# Patient Record
Sex: Female | Born: 1980 | Race: White | Hispanic: No | Marital: Single | Smoking: Never smoker
Health system: Southern US, Community
[De-identification: ages and names within clinical notes are randomized; demographics above are authoritative.]

## PROBLEM LIST (undated history)

## (undated) HISTORY — PX: MYRINGOTOMY WITH TUBE PLACEMENT: SHX5663

---
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
## 2007-08-16 LAB — TSH: TSH: 2.35 (ref <=5.90)

## 2007-08-16 LAB — CBC AND DIFFERENTIAL
HCT: 35 — AB (ref 36–46)
Hemoglobin: 11.9 — AB (ref 12.0–16.0)
Neutrophils Absolute: 4
Platelets: 278 (ref 150–399)
WBC: 6.8

## 2007-08-16 LAB — HEPATIC FUNCTION PANEL
ALT: 23 (ref 7–35)
AST: 18 (ref 13–35)
Alkaline Phosphatase: 43 (ref 25–125)
Bilirubin, Total: 0.4

## 2007-08-16 LAB — LIPID PANEL
Cholesterol: 183 (ref 0–200)
HDL: 56 (ref 35–70)
LDL Cholesterol: 116
LDl/HDL Ratio: 3.3
Triglycerides: 55 (ref 40–160)

## 2007-08-16 LAB — BASIC METABOLIC PANEL
BUN: 10 (ref 4–21)
Creatinine: 0.6 (ref <=1.1)
Glucose: 96
Potassium: 4.3 (ref 3.4–5.3)
Sodium: 140 (ref 137–147)

## 2007-08-16 LAB — VITAMIN D 25 HYDROXY (VIT D DEFICIENCY, FRACTURES): Vit D, 25-Hydroxy: 19.8

## 2017-01-29 ENCOUNTER — Encounter: Payer: Self-pay | Admitting: Physician Assistant

## 2017-01-29 ENCOUNTER — Telehealth: Payer: Self-pay | Admitting: Physician Assistant

## 2017-01-29 ENCOUNTER — Ambulatory Visit (INDEPENDENT_AMBULATORY_CARE_PROVIDER_SITE_OTHER): Payer: BLUE CROSS/BLUE SHIELD | Admitting: Physician Assistant

## 2017-01-29 VITALS — BP 132/80 | HR 88 | Resp 16 | Ht 67.0 in | Wt 216.0 lb

## 2017-01-29 DIAGNOSIS — J45909 Unspecified asthma, uncomplicated: Secondary | ICD-10-CM | POA: Diagnosis not present

## 2017-01-29 DIAGNOSIS — R12 Heartburn: Secondary | ICD-10-CM

## 2017-01-29 DIAGNOSIS — G43109 Migraine with aura, not intractable, without status migrainosus: Secondary | ICD-10-CM

## 2017-01-29 DIAGNOSIS — Z800 Family history of malignant neoplasm of digestive organs: Secondary | ICD-10-CM | POA: Diagnosis not present

## 2017-01-29 DIAGNOSIS — Z7689 Persons encountering health services in other specified circumstances: Secondary | ICD-10-CM

## 2017-01-29 DIAGNOSIS — J3089 Other allergic rhinitis: Secondary | ICD-10-CM

## 2017-01-29 NOTE — Telephone Encounter (Signed)
Pt called to advise she takes Imitrex 100 MG.  NF#[phone]/IOCB#[phone]/MW

## 2017-01-29 NOTE — Telephone Encounter (Signed)
Updated in chart

## 2017-01-29 NOTE — Patient Instructions (Signed)
Please get previous lab records from work.   Please confirm dose of Imitrex.  Health Maintenance, Female Adopting a healthy lifestyle and getting preventive care can go a long way to promote health and wellness. Talk with your health care provider about what schedule of regular examinations is right for you. This is a good chance for you to check in with your provider about disease prevention and staying healthy. In between checkups, there are plenty of things you can do on your own. Experts have done a lot of research about which lifestyle changes and preventive measures are most likely to keep you healthy. Ask your health care provider for more information. Weight and diet Eat a healthy diet  Be sure to include plenty of vegetables, fruits, low-fat dairy products, and lean protein.  Do not eat a lot of foods high in solid fats, added sugars, or salt.  Get regular exercise. This is one of the most important things you can do for your health. ? Most adults should exercise for at least 150 minutes each week. The exercise should increase your heart rate and make you sweat (moderate-intensity exercise). ? Most adults should also do strengthening exercises at least twice a week. This is in addition to the moderate-intensity exercise.  Maintain a healthy weight  Body mass index (BMI) is a measurement that can be used to identify possible weight problems. It estimates body fat based on height and weight. Your health care provider can help determine your BMI and help you achieve or maintain a healthy weight.  For females 78 years of age and older: ? A BMI below 18.5 is considered underweight. ? A BMI of 18.5 to 24.9 is normal. ? A BMI of 25 to 29.9 is considered overweight. ? A BMI of 30 and above is considered obese.  Watch levels of cholesterol and blood lipids  You should start having your blood tested for lipids and cholesterol at 34 years of age, then have this test every 5 years.  You  may need to have your cholesterol levels checked more often if: ? Your lipid or cholesterol levels are high. ? You are older than 34 years of age. ? You are at high risk for heart disease.  Cancer screening Lung Cancer  Lung cancer screening is recommended for adults 28-27 years old who are at high risk for lung cancer because of a history of smoking.  A yearly low-dose CT scan of the lungs is recommended for people who: ? Currently smoke. ? Have quit within the past 15 years. ? Have at least a 30-pack-year history of smoking. A pack year is smoking an average of one pack of cigarettes a day for 1 year.  Yearly screening should continue until it has been 15 years since you quit.  Yearly screening should stop if you develop a health problem that would prevent you from having lung cancer treatment.  Breast Cancer  Practice breast self-awareness. This means understanding how your breasts normally appear and feel.  It also means doing regular breast self-exams. Let your health care provider know about any changes, no matter how small.  If you are in your 20s or 30s, you should have a clinical breast exam (CBE) by a health care provider every 1-3 years as part of a regular health exam.  If you are 29 or older, have a CBE every year. Also consider having a breast X-ray (mammogram) every year.  If you have a family history of breast cancer, talk  to your health care provider about genetic screening.  If you are at high risk for breast cancer, talk to your health care provider about having an MRI and a mammogram every year.  Breast cancer gene (BRCA) assessment is recommended for women who have family members with BRCA-related cancers. BRCA-related cancers include: ? Breast. ? Ovarian. ? Tubal. ? Peritoneal cancers.  Results of the assessment will determine the need for genetic counseling and BRCA1 and BRCA2 testing.  Cervical Cancer Your health care provider may recommend that you  be screened regularly for cancer of the pelvic organs (ovaries, uterus, and vagina). This screening involves a pelvic examination, including checking for microscopic changes to the surface of your cervix (Pap test). You may be encouraged to have this screening done every 3 years, beginning at age 50.  For women ages 57-65, health care providers may recommend pelvic exams and Pap testing every 3 years, or they may recommend the Pap and pelvic exam, combined with testing for human papilloma virus (HPV), every 5 years. Some types of HPV increase your risk of cervical cancer. Testing for HPV may also be done on women of any age with unclear Pap test results.  Other health care providers may not recommend any screening for nonpregnant women who are considered low risk for pelvic cancer and who do not have symptoms. Ask your health care provider if a screening pelvic exam is right for you.  If you have had past treatment for cervical cancer or a condition that could lead to cancer, you need Pap tests and screening for cancer for at least 20 years after your treatment. If Pap tests have been discontinued, your risk factors (such as having a new sexual partner) need to be reassessed to determine if screening should resume. Some women have medical problems that increase the chance of getting cervical cancer. In these cases, your health care provider may recommend more frequent screening and Pap tests.  Colorectal Cancer  This type of cancer can be detected and often prevented.  Routine colorectal cancer screening usually begins at 34 years of age and continues through 34 years of age.  Your health care provider may recommend screening at an earlier age if you have risk factors for colon cancer.  Your health care provider may also recommend using home test kits to check for hidden blood in the stool.  A small camera at the end of a tube can be used to examine your colon directly (sigmoidoscopy or  colonoscopy). This is done to check for the earliest forms of colorectal cancer.  Routine screening usually begins at age 33.  Direct examination of the colon should be repeated every 5-10 years through 34 years of age. However, you may need to be screened more often if early forms of precancerous polyps or small growths are found.  Skin Cancer  Check your skin from head to toe regularly.  Tell your health care provider about any new moles or changes in moles, especially if there is a change in a mole's shape or color.  Also tell your health care provider if you have a mole that is larger than the size of a pencil eraser.  Always use sunscreen. Apply sunscreen liberally and repeatedly throughout the day.  Protect yourself by wearing long sleeves, pants, a wide-brimmed hat, and sunglasses whenever you are outside.  Heart disease, diabetes, and high blood pressure  High blood pressure causes heart disease and increases the risk of stroke. High blood pressure is more  likely to develop in: ? People who have blood pressure in the high end of the normal range (130-139/85-89 mm Hg). ? People who are overweight or obese. ? People who are African American.  If you are 28-80 years of age, have your blood pressure checked every 3-5 years. If you are 21 years of age or older, have your blood pressure checked every year. You should have your blood pressure measured twice--once when you are at a hospital or clinic, and once when you are not at a hospital or clinic. Record the average of the two measurements. To check your blood pressure when you are not at a hospital or clinic, you can use: ? An automated blood pressure machine at a pharmacy. ? A home blood pressure monitor.  If you are between 4 years and 34 years old, ask your health care provider if you should take aspirin to prevent strokes.  Have regular diabetes screenings. This involves taking a blood sample to check your fasting blood sugar  level. ? If you are at a normal weight and have a low risk for diabetes, have this test once every three years after 34 years of age. ? If you are overweight and have a high risk for diabetes, consider being tested at a younger age or more often. Preventing infection Hepatitis B  If you have a higher risk for hepatitis B, you should be screened for this virus. You are considered at high risk for hepatitis B if: ? You were born in a country where hepatitis B is common. Ask your health care provider which countries are considered high risk. ? Your parents were born in a high-risk country, and you have not been immunized against hepatitis B (hepatitis B vaccine). ? You have HIV or AIDS. ? You use needles to inject street drugs. ? You live with someone who has hepatitis B. ? You have had sex with someone who has hepatitis B. ? You get hemodialysis treatment. ? You take certain medicines for conditions, including cancer, organ transplantation, and autoimmune conditions.  Hepatitis C  Blood testing is recommended for: ? Everyone born from [birth year] through 1963. ? Anyone with known risk factors for hepatitis C.  Sexually transmitted infections (STIs)  You should be screened for sexually transmitted infections (STIs) including gonorrhea and chlamydia if: ? You are sexually active and are younger than 34 years of age. ? You are older than 34 years of age and your health care provider tells you that you are at risk for this type of infection. ? Your sexual activity has changed since you were last screened and you are at an increased risk for chlamydia or gonorrhea. Ask your health care provider if you are at risk.  If you do not have HIV, but are at risk, it may be recommended that you take a prescription medicine daily to prevent HIV infection. This is called pre-exposure prophylaxis (PrEP). You are considered at risk if: ? You are sexually active and do not regularly use condoms or know the HIV  status of your partner(s). ? You take drugs by injection. ? You are sexually active with a partner who has HIV.  Talk with your health care provider about whether you are at high risk of being infected with HIV. If you choose to begin PrEP, you should first be tested for HIV. You should then be tested every 3 months for as long as you are taking PrEP. Pregnancy  If you are premenopausal and you may  become pregnant, ask your health care provider about preconception counseling.  If you may become pregnant, take 400 to 800 micrograms (mcg) of folic acid every day.  If you want to prevent pregnancy, talk to your health care provider about birth control (contraception). Osteoporosis and menopause  Osteoporosis is a disease in which the bones lose minerals and strength with aging. This can result in serious bone fractures. Your risk for osteoporosis can be identified using a bone density scan.  If you are 49 years of age or older, or if you are at risk for osteoporosis and fractures, ask your health care provider if you should be screened.  Ask your health care provider whether you should take a calcium or vitamin D supplement to lower your risk for osteoporosis.  Menopause may have certain physical symptoms and risks.  Hormone replacement therapy may reduce some of these symptoms and risks. Talk to your health care provider about whether hormone replacement therapy is right for you. Follow these instructions at home:  Schedule regular health, dental, and eye exams.  Stay current with your immunizations.  Do not use any tobacco products including cigarettes, chewing tobacco, or electronic cigarettes.  If you are pregnant, do not drink alcohol.  If you are breastfeeding, limit how much and how often you drink alcohol.  Limit alcohol intake to no more than 1 drink per day for nonpregnant women. One drink equals 12 ounces of beer, 5 ounces of wine, or 1 ounces of hard liquor.  Do not  use street drugs.  Do not share needles.  Ask your health care provider for help if you need support or information about quitting drugs.  Tell your health care provider if you often feel depressed.  Tell your health care provider if you have ever been abused or do not feel safe at home. This information is not intended to replace advice given to you by your health care provider. Make sure you discuss any questions you have with your health care provider. Document Released: 01/08/2011 Document Revised: 12/01/2015 Document Reviewed: 03/29/2015 Elsevier Interactive Patient Education  [employer].

## 2017-01-29 NOTE — Progress Notes (Signed)
Patient: Kim Thompson Female    DOB: 06-Jul-1981   36 y.o.   MRN: [id number] Visit Date: 01/29/2017  Today's Provider: Trey Sailors, PA-C   Chief Complaint  Patient presents with  . Establish Care  . Asthma  . Migraine   Subjective:      Kim Thompson is a 36 y/o woman who is presenting today to establish care. She moved down here from the [place] area to work at [employer]. She has been here since February, living in [place] since February. Previously worked in Landscape architect for [employer]. Has four rabbits.   Not sexually active, may be entering relationship. Cannot remember last PAP smear.  She has paternal grandmother with history of breast cancer. She has maternal uncle with history of colon cancer in 42's or 34's, mother was screened and was normal.   She has a history of migraines with aura for which she takes Imitrex. She has 1-3 migraines per month, thinks they may be associated with menstrual cycle. Has visual floaters as aura.   Had labs done with employment, would like this.  Has history of asthma, well controlled on Advair 100-50 2 puffs daily and rescue inhaler. Does also have nebulizer treatment. Asthma worse with humid air in [place]. Is also on singulair.   Would like to try coming off omeprazole that she uses for heartburn.   Asthma  This is a chronic problem. The problem has been unchanged (Pt reports it is in decent control. ). Pertinent negatives include no appetite change, chest pain, dyspnea on exertion, ear congestion, ear pain, fever, headaches, heartburn, malaise/fatigue, myalgias, nasal congestion, orthopnea, PND, postnasal drip, rhinorrhea, sneezing, sore throat, sweats, trouble swallowing or weight loss. Her past medical history is significant for asthma.  Migraine   This is a chronic problem. The problem has been unchanged. The pain is located in the right unilateral region. The pain does not radiate. The pain quality is  similar to prior headaches. The quality of the pain is described as throbbing. Pertinent negatives include no back pain, dizziness, ear pain, fever, neck pain, rhinorrhea, sore throat or weight loss. She has tried triptans for the symptoms. The treatment provided significant relief.       Allergies  Allergen Reactions  . Augmentin [Amoxicillin-Pot Clavulanate]     Vomiting  . Sulfa Antibiotics     Rash     Current Outpatient Prescriptions:  .  albuterol (ACCUNEB) 1.25 MG/3ML nebulizer solution, Take 1 ampule by nebulization every 6 (six) hours as needed for wheezing., Disp: , Rfl:  .  albuterol (VENTOLIN HFA) 108 (90 Base) MCG/ACT inhaler, Inhale 2 puffs into the lungs every 4 (four) hours as needed for wheezing or shortness of breath., Disp: , Rfl:  .  fexofenadine (ALLEGRA) 180 MG tablet, Take 180 mg by mouth daily., Disp: , Rfl:  .  Fluticasone-Salmeterol (ADVAIR DISKUS) 100-50 MCG/DOSE AEPB, Inhale 1 puff into the lungs 2 (two) times daily., Disp: , Rfl:  .  montelukast (SINGULAIR) 10 MG tablet, Take 10 mg by mouth at bedtime., Disp: , Rfl:  .  omeprazole (PRILOSEC) 20 MG capsule, Take 20 mg by mouth daily., Disp: , Rfl:   Review of Systems  Constitutional: Negative.  Negative for appetite change, fever, malaise/fatigue and weight loss.  HENT: Negative.  Negative for ear pain, postnasal drip, rhinorrhea, sneezing, sore throat and trouble swallowing.   Eyes: Negative.   Respiratory: Negative.   Cardiovascular: Negative.  Negative for  chest pain, dyspnea on exertion and PND.  Gastrointestinal: Negative.  Negative for heartburn.  Endocrine: Negative.   Genitourinary: Negative.   Musculoskeletal: Positive for neck stiffness. Negative for arthralgias, back pain, gait problem, joint swelling, myalgias and neck pain.  Skin: Positive for rash. Negative for color change, pallor and wound.  Allergic/Immunologic: Positive for environmental allergies. Negative for food allergies and  immunocompromised state.  Neurological: Negative for dizziness, light-headedness and headaches.  Hematological: Negative.   Psychiatric/Behavioral: Negative.     Social History  Substance Use Topics  . Smoking status: Never Smoker  . Smokeless tobacco: Never Used  . Alcohol use Yes     Comment: Very Rarely   Objective:   BP 132/80 (BP Location: Left Arm, Patient Position: Sitting, Cuff Size: Normal)   Pulse 88   Resp 16   Ht 5\' 7"  (1.702 m)   Wt 216 lb (98 kg)   SpO2 95%   BMI 33.83 kg/m  Vitals:   01/29/17 1521  BP: 132/80  Pulse: 88  Resp: 16  SpO2: 95%  Weight: 216 lb (98 kg)  Height: 5\' 7"  (1.702 m)     Physical Exam  Constitutional: She is oriented to person, place, and time. She appears well-developed and well-nourished.  HENT:  Right Ear: Tympanic membrane and external ear normal.  Left Ear: Tympanic membrane and external ear normal.  Mouth/Throat: Oropharynx is clear and moist. No oropharyngeal exudate.  Eyes: Conjunctivae are normal.  Neck: Neck supple. No thyromegaly present.  Cardiovascular: Normal rate and regular rhythm.   Pulmonary/Chest: Effort normal and breath sounds normal.  Abdominal: Soft. Bowel sounds are normal.  Lymphadenopathy:    She has no cervical adenopathy.  Neurological: She is alert and oriented to person, place, and time.  Skin: Skin is warm and dry.  Psychiatric: She has a normal mood and affect. Her behavior is normal.        Assessment & Plan:     1. Encounter to establish care  Had labs done with employer, would like to get those records. Signed ROI to get previous PCP records and vaccines. Return one month for physical with PAP/HPV.  2. Asthma, unspecified asthma severity, unspecified whether complicated, unspecified whether persistent  Controlled with Advair, rescue, and nebulizer.  3. Heartburn  Talked about lifestyle and diet modifications including avoidance of trigger foods, yoga block under mattress, moderate  weight loss. Can try tapering off omeprazole after doing these modifications.  4. Migraine with aura and without status migrainosus, not intractable  Good relief with imitrex. Not candidate for estrogen OCPs.  5. Family history of colon cancer  Maternal uncle, 40s-50's. Doesn't meet criteria for early screening, keep eye out for changes in family history.  Return in about 1 month (around 03/01/2017) for annual physical .  The entirety of the information documented in the History of Present Illness, Review of Systems and Physical Exam were personally obtained by me. Portions of this information were initially documented by Kavin LeechLaura Walsh, CMA and reviewed by me for thoroughness and accuracy.          Trey SailorsAdriana M Pollak, PA-C  [facility]

## 2017-01-30 DIAGNOSIS — J45909 Unspecified asthma, uncomplicated: Secondary | ICD-10-CM | POA: Insufficient documentation

## 2017-01-30 DIAGNOSIS — G43109 Migraine with aura, not intractable, without status migrainosus: Secondary | ICD-10-CM | POA: Insufficient documentation

## 2017-01-30 DIAGNOSIS — Z800 Family history of malignant neoplasm of digestive organs: Secondary | ICD-10-CM | POA: Insufficient documentation

## 2017-01-30 DIAGNOSIS — R12 Heartburn: Secondary | ICD-10-CM | POA: Insufficient documentation

## 2017-01-30 MED ORDER — SUMATRIPTAN SUCCINATE 100 MG PO TABS: ORAL_TABLET | ORAL | 0 refills | Status: DC

## 2017-01-30 MED ORDER — FLUTICASONE-SALMETEROL 100-50 MCG/DOSE IN AEPB: 1.0000 | INHALATION_SPRAY | Freq: Two times a day (BID) | RESPIRATORY_TRACT | 2 refills | Status: DC

## 2017-01-30 MED ORDER — FEXOFENADINE HCL 180 MG PO TABS: 180.0000 mg | ORAL_TABLET | Freq: Every day | ORAL | 0 refills | Status: DC

## 2017-01-30 MED ORDER — ALBUTEROL SULFATE 1.25 MG/3ML IN NEBU: 1.0000 | INHALATION_SOLUTION | Freq: Four times a day (QID) | RESPIRATORY_TRACT | 3 refills | Status: DC | PRN

## 2017-01-30 MED ORDER — MONTELUKAST SODIUM 10 MG PO TABS: 10.0000 mg | ORAL_TABLET | Freq: Every day | ORAL | 0 refills | Status: DC

## 2017-01-30 NOTE — Telephone Encounter (Signed)
Noted, thanks!

## 2017-04-01 ENCOUNTER — Ambulatory Visit (INDEPENDENT_AMBULATORY_CARE_PROVIDER_SITE_OTHER): Payer: BLUE CROSS/BLUE SHIELD | Admitting: Physician Assistant

## 2017-04-01 ENCOUNTER — Encounter: Payer: Self-pay | Admitting: Physician Assistant

## 2017-04-01 VITALS — BP 114/68 | HR 92 | Temp 99.0°F | Resp 16 | Ht 67.0 in | Wt 216.0 lb

## 2017-04-01 DIAGNOSIS — Z0000 Encounter for general adult medical examination without abnormal findings: Secondary | ICD-10-CM | POA: Diagnosis not present

## 2017-04-01 DIAGNOSIS — Z23 Encounter for immunization: Secondary | ICD-10-CM

## 2017-04-01 DIAGNOSIS — Z124 Encounter for screening for malignant neoplasm of cervix: Secondary | ICD-10-CM

## 2017-04-01 NOTE — Progress Notes (Signed)
Patient: Kim Thompson, Female    DOB: [birth year], 36 y.o.   MRN: [id number] Visit Date: 04/01/2017  Today's Provider: Trey Sailors, PA-C   Chief Complaint  Patient presents with  . Annual Exam   Subjective:    Annual physical exam Kim Thompson is a 37 y.o. female who presents today for health maintenance and complete physical. She feels well. She reports exercising some, but has recently started in the last two weeks. She reports she is sleeping well.  Due for PAP/HPV today Maternal uncle with colon cancer in his late 59's or 50's, nothing in mother or maternal grandparents. No smoking, drugs.   Some asthma, as reviewed in establish care visit.   Would like her flu shot today. -----------------------------------------------------------------   Review of Systems  Constitutional: Negative.   HENT: Negative.   Eyes: Negative.   Respiratory: Negative.   Cardiovascular: Negative.   Gastrointestinal: Negative.   Endocrine: Negative.   Genitourinary: Negative.   Musculoskeletal: Positive for myalgias. Negative for arthralgias, back pain, gait problem, joint swelling, neck pain and neck stiffness.  Skin: Negative.   Allergic/Immunologic: Positive for environmental allergies. Negative for food allergies and immunocompromised state.  Neurological: Positive for headaches.  Hematological: Negative.   Psychiatric/Behavioral: Negative.     Social History      She  reports that she has never smoked. She has never used smokeless tobacco. She reports that she drinks alcohol. She reports that she does not use drugs.       Social History   Social History  . Marital status: Single    Spouse name: N/A  . Number of children: N/A  . Years of education: N/A   Occupational History  . Full time [employer]   Social History Main Topics  . Smoking status: Never Smoker  . Smokeless tobacco: Never Used  . Alcohol use Yes     Comment: Very Rarely  . Drug use:  No  . Sexual activity: Yes    Partners: Male    Birth control/ protection: Condom   Other Topics Concern  . None   Social History Narrative  . None    No past medical history on file.   Patient Active Problem List   Diagnosis Date Noted  . Asthma 01/30/2017  . Heartburn 01/30/2017  . Migraine with aura 01/30/2017  . Family history of colon cancer 01/30/2017    Past Surgical History:  Procedure Laterality Date  . ADENOIDECTOMY  1994  . CHOLECYSTECTOMY  2003  . MYRINGOTOMY WITH TUBE PLACEMENT Bilateral    X's three times.    Family History        Family Status  Relation Status  . Mother Alive  . Father Alive  . Brother Alive  . MGM (Not Specified)  . MGF (Not Specified)  . PGM Deceased  . PGF (Not Specified)  . Mat Uncle (Not Specified)  . Mat Uncle (Not Specified)        Her family history includes Aortic aneurysm in her maternal grandfather; Asthma in her brother, father, and mother; Breast cancer in her paternal grandmother; Colon cancer in her maternal uncle; Diabetes in her maternal uncle and paternal grandmother; Healthy in her maternal grandmother; Heart attack in her paternal grandmother; Hypertension in her paternal grandfather; Irregular heart beat in her mother; Osteoporosis in her maternal grandmother; Stroke in her paternal grandfather and paternal grandmother.     Allergies  Allergen Reactions  . Augmentin [Amoxicillin-Pot  Clavulanate]     Vomiting  . Sulfa Antibiotics     Rash     Current Outpatient Prescriptions:  .  albuterol (ACCUNEB) 1.25 MG/3ML nebulizer solution, Take 3 mLs (1.25 mg total) by nebulization every 6 (six) hours as needed for wheezing., Disp: 75 mL, Rfl: 3 .  albuterol (VENTOLIN HFA) 108 (90 Base) MCG/ACT inhaler, Inhale 2 puffs into the lungs every 4 (four) hours as needed for wheezing or shortness of breath., Disp: , Rfl:  .  fexofenadine (ALLEGRA) 180 MG tablet, Take 1 tablet (180 mg total) by mouth daily., Disp: 90 tablet,  Rfl: 0 .  Fluticasone-Salmeterol (ADVAIR DISKUS) 100-50 MCG/DOSE AEPB, Inhale 1 puff into the lungs 2 (two) times daily., Disp: 60 each, Rfl: 2 .  montelukast (SINGULAIR) 10 MG tablet, Take 1 tablet (10 mg total) by mouth at bedtime., Disp: 90 tablet, Rfl: 0 .  omeprazole (PRILOSEC) 20 MG capsule, Take 20 mg by mouth daily., Disp: , Rfl:  .  SUMAtriptan (IMITREX) 100 MG tablet, Take one pill at headache onset, may repeat in two hours if not relieved. Max 200 mg daily., Disp: 9 tablet, Rfl: 0   Patient Care Team: Maryella Shivers as PCP - General (Physician Assistant)      Objective:   Vitals: BP 114/68 (BP Location: Left Arm, Patient Position: Sitting, Cuff Size: Large)   Pulse 92   Temp 99 F (37.2 C) (Oral)   Resp 16   Ht  (1.702 m)   Wt 216 lb (98 kg)   LMP 03/11/2017   BMI 33.83 kg/m    Vitals:   04/01/17 1418  BP: 114/68  Pulse: 92  Resp: 16  Temp: 99 F (37.2 C)  TempSrc: Oral  Weight: 216 lb (98 kg)  Height:  (1.702 m)     Physical Exam  Constitutional: She is oriented to person, place, and time. She appears well-developed and well-nourished.  HENT:  Right Ear: External ear normal.  Left Ear: External ear normal.  Eyes: Conjunctivae are normal.  Neck: Neck supple.  Cardiovascular: Normal rate and regular rhythm.   Pulmonary/Chest: Effort normal and breath sounds normal. Right breast exhibits no inverted nipple, no mass, no nipple discharge, no skin change and no tenderness. Left breast exhibits no inverted nipple, no mass, no nipple discharge, no skin change and no tenderness. Breasts are symmetrical.  Abdominal: Soft. Bowel sounds are normal. There is no tenderness.  Genitourinary: There is no rash, tenderness, lesion or injury on the right labia. There is no rash, tenderness, lesion or injury on the left labia. Uterus is not deviated, not enlarged, not fixed and not tender. Cervix exhibits no motion tenderness, no discharge and no friability.  Right adnexum displays no mass, no tenderness and no fullness. Left adnexum displays no mass, no tenderness and no fullness. No erythema, tenderness or bleeding in the vagina. No foreign body in the vagina. No signs of injury around the vagina. No vaginal discharge found.  Lymphadenopathy:    She has no cervical adenopathy.  Neurological: She is alert and oriented to person, place, and time.  Skin: Skin is warm and dry.  Psychiatric: She has a normal mood and affect. Her behavior is normal.     Depression Screen PHQ 2/9 Scores 04/01/2017 02/01/2017  PHQ - 2 Score 0 1  PHQ- 9 Score 1 3      Assessment & Plan:     Routine Health Maintenance and Physical Exam  Exercise Activities and  Dietary recommendations Goals    None       There is no immunization history on file for this patient.  Health Maintenance  Topic Date Due  . HIV Screening  09/04/1997  . TETANUS/TDAP  09/04/2001  . PAP SMEAR  09/05/2003  . INFLUENZA VACCINE  02/06/2017     Discussed health benefits of physical activity, and encouraged her to engage in regular exercise appropriate for her age and condition.   1. Annual physical exam   2. Cervical cancer screening  - Pap IG and HPV (high risk) DNA detection  3. Need for influenza vaccination  Updated today.   --------------------------------------------------------------------    Trey Sailors, PA-C  [facility]

## 2017-04-01 NOTE — Addendum Note (Signed)
Addended by: Kavin Leech E on: 04/01/2017 04:09 PM   Modules accepted: Orders

## 2017-04-01 NOTE — Patient Instructions (Signed)

## 2017-04-01 NOTE — Addendum Note (Signed)
Addended by: Trey Sailors on: 04/01/2017 03:57 PM   Modules accepted: Orders

## 2017-04-01 NOTE — Addendum Note (Signed)
Addended by: Trey Sailors on: 04/01/2017 03:51 PM   Modules accepted: Orders

## 2017-04-02 LAB — PAP IG AND HPV HIGH-RISK: HPV DNA High Risk: NOT DETECTED

## 2017-04-03 ENCOUNTER — Telehealth: Payer: Self-pay

## 2017-04-03 NOTE — Telephone Encounter (Signed)
LMTCB  04/03/2017  Thanks,   -Phebe Dettmer  

## 2017-04-03 NOTE — Telephone Encounter (Signed)
-----   Message from Trey Sailors, [place] sent at 04/03/2017  8:35 AM EDT ----- PAP normal, HPV negative. Repeat 5 years.

## 2017-04-05 NOTE — Telephone Encounter (Signed)
[id number] 

## 2017-04-25 ENCOUNTER — Telehealth: Payer: Self-pay | Admitting: Physician Assistant

## 2017-04-25 NOTE — Telephone Encounter (Signed)
[facility]

## 2017-05-03 ENCOUNTER — Other Ambulatory Visit: Payer: Self-pay | Admitting: Physician Assistant

## 2017-05-03 DIAGNOSIS — J45909 Unspecified asthma, uncomplicated: Secondary | ICD-10-CM

## 2017-05-03 DIAGNOSIS — J3089 Other allergic rhinitis: Secondary | ICD-10-CM

## 2017-06-05 ENCOUNTER — Other Ambulatory Visit: Payer: Self-pay | Admitting: Physician Assistant

## 2017-06-05 DIAGNOSIS — G43109 Migraine with aura, not intractable, without status migrainosus: Secondary | ICD-10-CM

## 2017-06-24 ENCOUNTER — Ambulatory Visit
Admission: RE | Admit: 2017-06-24 | Discharge: 2017-06-24 | Disposition: A | Discharge status: Home / Self Care | Payer: BLUE CROSS/BLUE SHIELD | Source: Ambulatory Visit | Attending: Family Medicine | Admitting: Family Medicine

## 2017-06-24 ENCOUNTER — Other Ambulatory Visit: Payer: Self-pay | Admitting: Family Medicine

## 2017-06-24 DIAGNOSIS — R05 Cough: Secondary | ICD-10-CM | POA: Insufficient documentation

## 2017-06-24 DIAGNOSIS — R509 Fever, unspecified: Secondary | ICD-10-CM

## 2017-06-24 DIAGNOSIS — R0602 Shortness of breath: Secondary | ICD-10-CM

## 2017-06-24 DIAGNOSIS — R059 Cough, unspecified: Secondary | ICD-10-CM

## 2017-07-15 ENCOUNTER — Other Ambulatory Visit: Payer: Self-pay | Admitting: Physician Assistant

## 2017-07-15 DIAGNOSIS — J45909 Unspecified asthma, uncomplicated: Secondary | ICD-10-CM

## 2017-07-15 NOTE — Telephone Encounter (Signed)
[facility]ed a refill request for a 90-days supply for the following medication. Thanks CC  Fluticasone-Salmeterol

## 2017-07-16 MED ORDER — FLUTICASONE-SALMETEROL 100-50 MCG/DOSE IN AEPB: 1.0000 | INHALATION_SPRAY | Freq: Two times a day (BID) | RESPIRATORY_TRACT | 2 refills | Status: DC

## 2017-10-17 ENCOUNTER — Other Ambulatory Visit: Payer: Self-pay | Admitting: Physician Assistant

## 2017-10-17 DIAGNOSIS — J45909 Unspecified asthma, uncomplicated: Secondary | ICD-10-CM

## 2017-10-17 MED ORDER — FLUTICASONE-SALMETEROL 100-50 MCG/DOSE IN AEPB: 1.0000 | INHALATION_SPRAY | Freq: Two times a day (BID) | RESPIRATORY_TRACT | 2 refills | Status: DC

## 2017-10-17 NOTE — Telephone Encounter (Signed)
Please review. Thanks!  

## 2017-10-17 NOTE — Telephone Encounter (Signed)
[facility]ed a refill request for the following medication. Thanks CC  Fluticasone-Salmeterol (ADVAIR DISKUS) 100-50 MCG/DOSE AEPB

## 2017-11-05 ENCOUNTER — Other Ambulatory Visit: Payer: Self-pay | Admitting: Physician Assistant

## 2017-11-05 DIAGNOSIS — G43109 Migraine with aura, not intractable, without status migrainosus: Secondary | ICD-10-CM

## 2017-12-01 ENCOUNTER — Other Ambulatory Visit: Payer: Self-pay | Admitting: Physician Assistant

## 2017-12-01 DIAGNOSIS — J3089 Other allergic rhinitis: Secondary | ICD-10-CM

## 2017-12-01 DIAGNOSIS — J45909 Unspecified asthma, uncomplicated: Secondary | ICD-10-CM

## 2017-12-05 ENCOUNTER — Other Ambulatory Visit: Payer: Self-pay | Admitting: Physician Assistant

## 2017-12-16 ENCOUNTER — Other Ambulatory Visit: Payer: Self-pay | Admitting: Otolaryngology

## 2017-12-16 DIAGNOSIS — G500 Trigeminal neuralgia: Secondary | ICD-10-CM

## 2017-12-16 DIAGNOSIS — H9209 Otalgia, unspecified ear: Secondary | ICD-10-CM | POA: Diagnosis not present

## 2017-12-16 DIAGNOSIS — H6983 Other specified disorders of Eustachian tube, bilateral: Secondary | ICD-10-CM | POA: Diagnosis not present

## 2017-12-27 ENCOUNTER — Ambulatory Visit
Admission: RE | Admit: 2017-12-27 | Discharge: 2017-12-27 | Disposition: A | Discharge status: Home / Self Care | Payer: BLUE CROSS/BLUE SHIELD | Source: Ambulatory Visit | Attending: Otolaryngology | Admitting: Otolaryngology

## 2017-12-27 DIAGNOSIS — G500 Trigeminal neuralgia: Secondary | ICD-10-CM

## 2017-12-27 DIAGNOSIS — H9201 Otalgia, right ear: Secondary | ICD-10-CM | POA: Insufficient documentation

## 2017-12-27 MED ORDER — GADOBENATE DIMEGLUMINE 529 MG/ML IV SOLN
19.0000 mL | Freq: Once | INTRAVENOUS | Status: AC | PRN
  Administered 2017-12-27: 19 mL via INTRAVENOUS

## 2018-01-15 ENCOUNTER — Other Ambulatory Visit: Payer: Self-pay | Admitting: Physician Assistant

## 2018-01-15 DIAGNOSIS — J45909 Unspecified asthma, uncomplicated: Secondary | ICD-10-CM

## 2018-01-15 NOTE — Telephone Encounter (Signed)
Please schedule patient for physical/follow up after 04/01/2018

## 2018-04-16 ENCOUNTER — Other Ambulatory Visit: Payer: Self-pay | Admitting: Physician Assistant

## 2018-04-16 DIAGNOSIS — J45909 Unspecified asthma, uncomplicated: Secondary | ICD-10-CM

## 2018-04-16 NOTE — Telephone Encounter (Signed)
Please schedule patient for follow up, hasn't been seen in one year.

## 2018-04-22 NOTE — Telephone Encounter (Signed)
lmtcb

## 2018-04-29 NOTE — Telephone Encounter (Signed)
Left patient a message advising her to call back to schedule a follow up appointment.  

## 2018-05-20 ENCOUNTER — Other Ambulatory Visit: Payer: Self-pay | Admitting: Physician Assistant

## 2018-05-20 DIAGNOSIS — J45909 Unspecified asthma, uncomplicated: Secondary | ICD-10-CM

## 2018-05-20 NOTE — Telephone Encounter (Signed)
Please schedule for CPE/follow up before more refills, hasn't been seen > 1 year.

## 2018-05-21 ENCOUNTER — Ambulatory Visit (INDEPENDENT_AMBULATORY_CARE_PROVIDER_SITE_OTHER): Payer: BLUE CROSS/BLUE SHIELD | Admitting: Physician Assistant

## 2018-05-21 ENCOUNTER — Other Ambulatory Visit (HOSPITAL_COMMUNITY)
Admission: RE | Admit: 2018-05-21 | Discharge: 2018-05-21 | Disposition: A | Discharge status: Home / Self Care | Payer: BLUE CROSS/BLUE SHIELD | Source: Ambulatory Visit | Attending: Physician Assistant | Admitting: Physician Assistant

## 2018-05-21 ENCOUNTER — Encounter: Payer: Self-pay | Admitting: Physician Assistant

## 2018-05-21 VITALS — BP 120/70 | HR 91 | Temp 98.3°F | Resp 16 | Ht 67.0 in | Wt 217.0 lb

## 2018-05-21 DIAGNOSIS — J45909 Unspecified asthma, uncomplicated: Secondary | ICD-10-CM | POA: Diagnosis not present

## 2018-05-21 DIAGNOSIS — Z113 Encounter for screening for infections with a predominantly sexual mode of transmission: Secondary | ICD-10-CM

## 2018-05-21 DIAGNOSIS — Z0000 Encounter for general adult medical examination without abnormal findings: Secondary | ICD-10-CM

## 2018-05-21 DIAGNOSIS — J3089 Other allergic rhinitis: Secondary | ICD-10-CM

## 2018-05-21 DIAGNOSIS — G43109 Migraine with aura, not intractable, without status migrainosus: Secondary | ICD-10-CM

## 2018-05-21 MED ORDER — ALBUTEROL SULFATE HFA 108 (90 BASE) MCG/ACT IN AERS
INHALATION_SPRAY | RESPIRATORY_TRACT | 2 refills | Status: DC
Start: 2018-05-21 — End: 2019-06-26

## 2018-05-21 MED ORDER — SUMATRIPTAN SUCCINATE 100 MG PO TABS: ORAL_TABLET | ORAL | 0 refills | Status: DC

## 2018-05-21 MED ORDER — FLUTICASONE-SALMETEROL 100-50 MCG/DOSE IN AEPB
INHALATION_SPRAY | RESPIRATORY_TRACT | 0 refills | Status: DC
Start: 2018-05-21 — End: 2018-06-19

## 2018-05-21 MED ORDER — MONTELUKAST SODIUM 10 MG PO TABS: ORAL_TABLET | ORAL | 3 refills | Status: DC

## 2018-05-21 NOTE — Progress Notes (Signed)
Patient: Kim Thompson Brocks, Female    DOB: [birth year], [age] y.o.   MRN: [id number] Visit Date: 05/23/2018  Today's Provider: Trey SailorsAdriana Thompson Kemontae Dunklee, PA-C   Chief Complaint  Patient presents with  . Annual Exam   Subjective:    Annual physical exam Kim Thompson Akridge is a 37 y.o. female who presents today for health maintenance and complete physical and follow up. She feels well. She reports exercising none. She reports she is sleeping well. Recently sold house in [place]. Had new sexual partner in interim between last visit. She is not currently sexually active and does not desire birth control.   PAP: 04/01/2017 normal and HPV negative. Tetanus: 2016 Labs: bloodwork through employer Colon Cancer: one maternal uncle Breast Cancer: none  Wt Readings from Last 3 Encounters:  05/21/18 217 lb (98.4 kg)  04/01/17 216 lb (98 kg)  01/29/17 216 lb (98 kg)   Asthma: Feels this is well controlled with Advair and albuterol inhalers.  Migraines: Several a month, does get good relief with imitrex 100 mg.   Heartburn: Takes omeprazole. Reports eating poorly recently due to recently selling her house and traveling back to [place].  -----------------------------------------------------------------   Review of Systems  Constitutional: Negative.   HENT: Negative.   Eyes: Negative.   Respiratory: Positive for shortness of breath.   Cardiovascular: Negative.   Gastrointestinal: Negative.   Endocrine: Negative.   Genitourinary: Negative.   Musculoskeletal: Negative.   Skin: Negative.   Allergic/Immunologic: Positive for environmental allergies.  Neurological: Negative.   Hematological: Negative.   Psychiatric/Behavioral: Negative.     Social History      She  reports that she has never smoked. She has never used smokeless tobacco. She reports that she drinks alcohol. She reports that she does not use drugs.       Social History   Socioeconomic History  . Marital status:  Single    Spouse name: Not on file  . Number of children: Not on file  . Years of education: Not on file  . Highest education level: Not on file  Occupational History  . Occupation: Full time    Employer: [employer]  Social Needs  . Financial resource strain: Not on file  . Food insecurity:    Worry: Not on file    Inability: Not on file  . Transportation needs:    Medical: Not on file    Non-medical: Not on file  Tobacco Use  . Smoking status: Never Smoker  . Smokeless tobacco: Never Used  Substance and Sexual Activity  . Alcohol use: Yes    Comment: Very Rarely  . Drug use: No  . Sexual activity: Yes    Partners: Male    Birth control/protection: Condom  Lifestyle  . Physical activity:    Days per week: Not on file    Minutes per session: Not on file  . Stress: Not on file  Relationships  . Social connections:    Talks on phone: Not on file    Gets together: Not on file    Attends religious service: Not on file    Active member of club or organization: Not on file    Attends meetings of clubs or organizations: Not on file    Relationship status: Not on file  Other Topics Concern  . Not on file  Social History Narrative  . Not on file    No past medical history on file.   Patient Active  Problem List   Diagnosis Date Noted  . Asthma 01/30/2017  . Heartburn 01/30/2017  . Migraine with aura 01/30/2017  . Family history of colon cancer 01/30/2017    Past Surgical History:  Procedure Laterality Date  . ADENOIDECTOMY  1994  . CHOLECYSTECTOMY  2003  . MYRINGOTOMY WITH TUBE PLACEMENT Bilateral    X's three times.    Family History        Family Status  Relation Name Status  . Mother  Alive  . Father  Alive  . Brother  Alive  . MGM  (Not Specified)  . MGF  (Not Specified)  . PGM  Deceased  . PGF  (Not Specified)  . Mat Uncle  (Not Specified)  . Mat Uncle  (Not Specified)        Her family history includes Aortic aneurysm in her maternal  grandfather; Asthma in her brother, father, and mother; Breast cancer in her paternal grandmother; Colon cancer in her maternal uncle; Diabetes in her maternal uncle and paternal grandmother; Healthy in her maternal grandmother; Heart attack in her paternal grandmother; Hypertension in her paternal grandfather; Irregular heart beat in her mother; Osteoporosis in her maternal grandmother; Stroke in her paternal grandfather and paternal grandmother.      Allergies  Allergen Reactions  . Augmentin [Amoxicillin-Pot Clavulanate]     Vomiting  . Sulfa Antibiotics     Rash     Current Outpatient Medications:  .  albuterol (ACCUNEB) 1.25 MG/3ML nebulizer solution, Take 3 mLs (1.25 mg total) by nebulization every 6 (six) hours as needed for wheezing., Disp: 75 mL, Rfl: 3 .  albuterol (VENTOLIN HFA) 108 (90 Base) MCG/ACT inhaler, INHALE 2 PUFFS EVERY 4 HRS. AS NEEDED, Disp: 18 Inhaler, Rfl: 2 .  fexofenadine (ALLEGRA) 180 MG tablet, TAKE 1 TABLET BY MOUTH EVERY DAY, Disp: 90 tablet, Rfl: 1 .  Fluticasone-Salmeterol (ADVAIR DISKUS) 100-50 MCG/DOSE AEPB, INHALE 1 PUFF BY MOUTH TWICE A DAY, Disp: 60 each, Rfl: 0 .  montelukast (SINGULAIR) 10 MG tablet, TAKE 1 TABLET BY MOUTH EVERYDAY AT BEDTIME, Disp: 90 tablet, Rfl: 3 .  omeprazole (PRILOSEC) 20 MG capsule, Take 20 mg by mouth daily., Disp: , Rfl:  .  SUMAtriptan (IMITREX) 100 MG tablet, May repeat in 2 hours if headache persists or recurs., Disp: 9 tablet, Rfl: 0   Patient Care Team: Maryella Shivers as PCP - General (Physician Assistant)      Objective:   Vitals: BP 120/70 (BP Location: Left Arm, Patient Position: Sitting, Cuff Size: Large)   Pulse 91   Temp 98.3 F (36.8 C) (Oral)   Resp 16   Ht 5\' 7"  (1.702 Thompson)   Wt 217 lb (98.4 kg)   SpO2 97%   BMI 33.99 kg/Thompson    Vitals:   05/21/18 1020  BP: 120/70  Pulse: 91  Resp: 16  Temp: 98.3 F (36.8 C)  TempSrc: Oral  SpO2: 97%  Weight: 217 lb (98.4 kg)  Height: 5\' 7"  (1.702 Thompson)      Physical Exam  Constitutional: She is oriented to person, place, and time. She appears well-developed and well-nourished.  HENT:  Right Ear: External ear normal.  Left Ear: External ear normal.  Mouth/Throat: Oropharynx is clear and moist.  Cardiovascular: Normal rate and regular rhythm.  Pulmonary/Chest: Effort normal and breath sounds normal.  Abdominal: Soft. Bowel sounds are normal.  Neurological: She is alert and oriented to person, place, and time.  Skin: Skin is warm and dry.  Psychiatric: She has a normal mood and affect. Her behavior is normal.     Depression Screen PHQ 2/9 Scores 05/21/2018 04/01/2017 02/01/2017  PHQ - 2 Score 0 0 1  PHQ- 9 Score 1 1 3       Assessment & Plan:     Routine Health Maintenance and Physical Exam  Exercise Activities and Dietary recommendations Goals   None     Immunization History  Administered Date(s) Administered  . Influenza,inj,Quad PF,6+ Mos 04/01/2017  . Influenza-Unspecified 04/04/2018    Health Maintenance  Topic Date Due  . HIV Screening  09/04/1997  . PAP SMEAR  04/01/2020  . TETANUS/TDAP  02/06/2025  . INFLUENZA VACCINE  Completed     Discussed health benefits of physical activity, and encouraged her to engage in regular exercise appropriate for her age and condition.    1. Annual physical exam   2. Asthma, unspecified asthma severity, unspecified whether complicated, unspecified whether persistent  Controlled, refill as below.  - Fluticasone-Salmeterol (ADVAIR DISKUS) 100-50 MCG/DOSE AEPB; INHALE 1 PUFF BY MOUTH TWICE A DAY  Dispense: 60 each; Refill: 0 - montelukast (SINGULAIR) 10 MG tablet; TAKE 1 TABLET BY MOUTH EVERYDAY AT BEDTIME  Dispense: 90 tablet; Refill: 3 - albuterol (VENTOLIN HFA) 108 (90 Base) MCG/ACT inhaler; INHALE 2 PUFFS EVERY 4 HRS. AS NEEDED  Dispense: 18 Inhaler; Refill: 2  3. Migraine with aura and without status migrainosus, not intractable  - SUMAtriptan (IMITREX) 100 MG tablet;  May repeat in 2 hours if headache persists or recurs.  Dispense: 9 tablet; Refill: 0  4. Seasonal allergic rhinitis due to other allergic trigger  - montelukast (SINGULAIR) 10 MG tablet; TAKE 1 TABLET BY MOUTH EVERYDAY AT BEDTIME  Dispense: 90 tablet; Refill: 3  5. Routine screening for STI (sexually transmitted infection)  - Urine cytology ancillary only  Return in about 1 year (around 05/22/2019) for cpe.  The entirety of the information documented in the History of Present Illness, Review of Systems and Physical Exam were personally obtained by me. Portions of this information were initially documented by Rondel Baton, CMA and reviewed by me for thoroughness and accuracy.   --------------------------------------------------------------------    Trey Sailors, PA-C  [facility]

## 2018-05-22 LAB — URINE CYTOLOGY ANCILLARY ONLY
Chlamydia: NEGATIVE
Neisseria Gonorrhea: NEGATIVE
Trichomonas: NEGATIVE

## 2018-06-18 ENCOUNTER — Other Ambulatory Visit: Payer: Self-pay | Admitting: Physician Assistant

## 2018-06-18 DIAGNOSIS — J45909 Unspecified asthma, uncomplicated: Secondary | ICD-10-CM

## 2018-09-09 ENCOUNTER — Other Ambulatory Visit: Payer: Self-pay | Admitting: Physician Assistant

## 2018-09-09 DIAGNOSIS — G43109 Migraine with aura, not intractable, without status migrainosus: Secondary | ICD-10-CM

## 2018-09-17 ENCOUNTER — Other Ambulatory Visit: Payer: Self-pay | Admitting: Physician Assistant

## 2018-09-17 DIAGNOSIS — J45909 Unspecified asthma, uncomplicated: Secondary | ICD-10-CM

## 2018-10-02 ENCOUNTER — Other Ambulatory Visit: Payer: Self-pay | Admitting: Physician Assistant

## 2018-10-02 DIAGNOSIS — G43109 Migraine with aura, not intractable, without status migrainosus: Secondary | ICD-10-CM

## 2018-10-02 NOTE — Telephone Encounter (Signed)
Pharmacy is requesting a refill. 

## 2018-10-14 ENCOUNTER — Other Ambulatory Visit: Payer: Self-pay | Admitting: Physician Assistant

## 2018-10-14 DIAGNOSIS — J45909 Unspecified asthma, uncomplicated: Secondary | ICD-10-CM

## 2018-11-03 IMAGING — CR DG CHEST 2V
1 series · 2 of 2 positions shown · non-contrast
Comparison: None in PACs

CLINICAL DATA: Fever, and shortness of breath. History of asthma,
nonsmoker.

EXAM:
CHEST  2 VIEW

[Series 1: dg chest 2 view · 0.14mm/px · 2 of 2 slices shown]
[im 1/2]
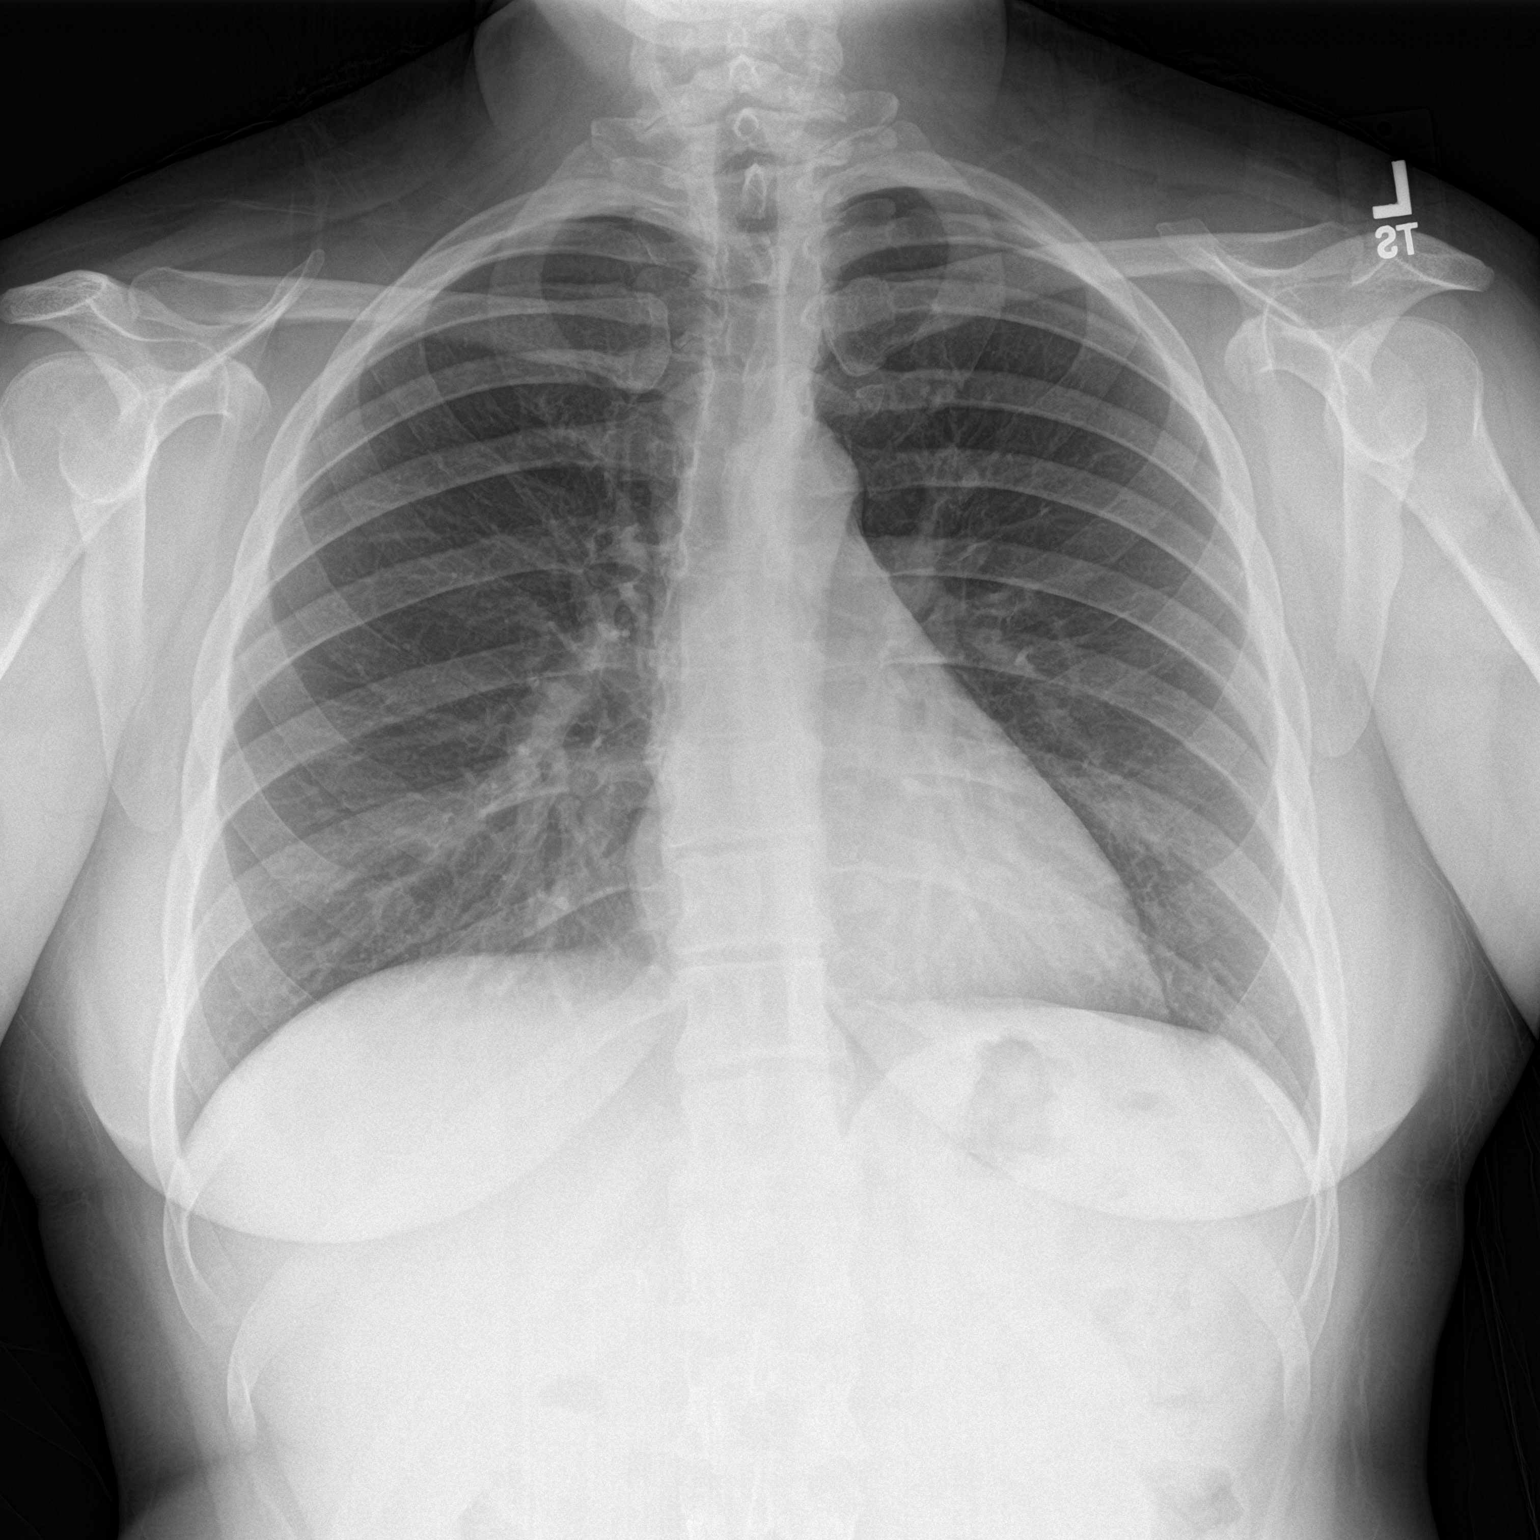
[im 2/2]
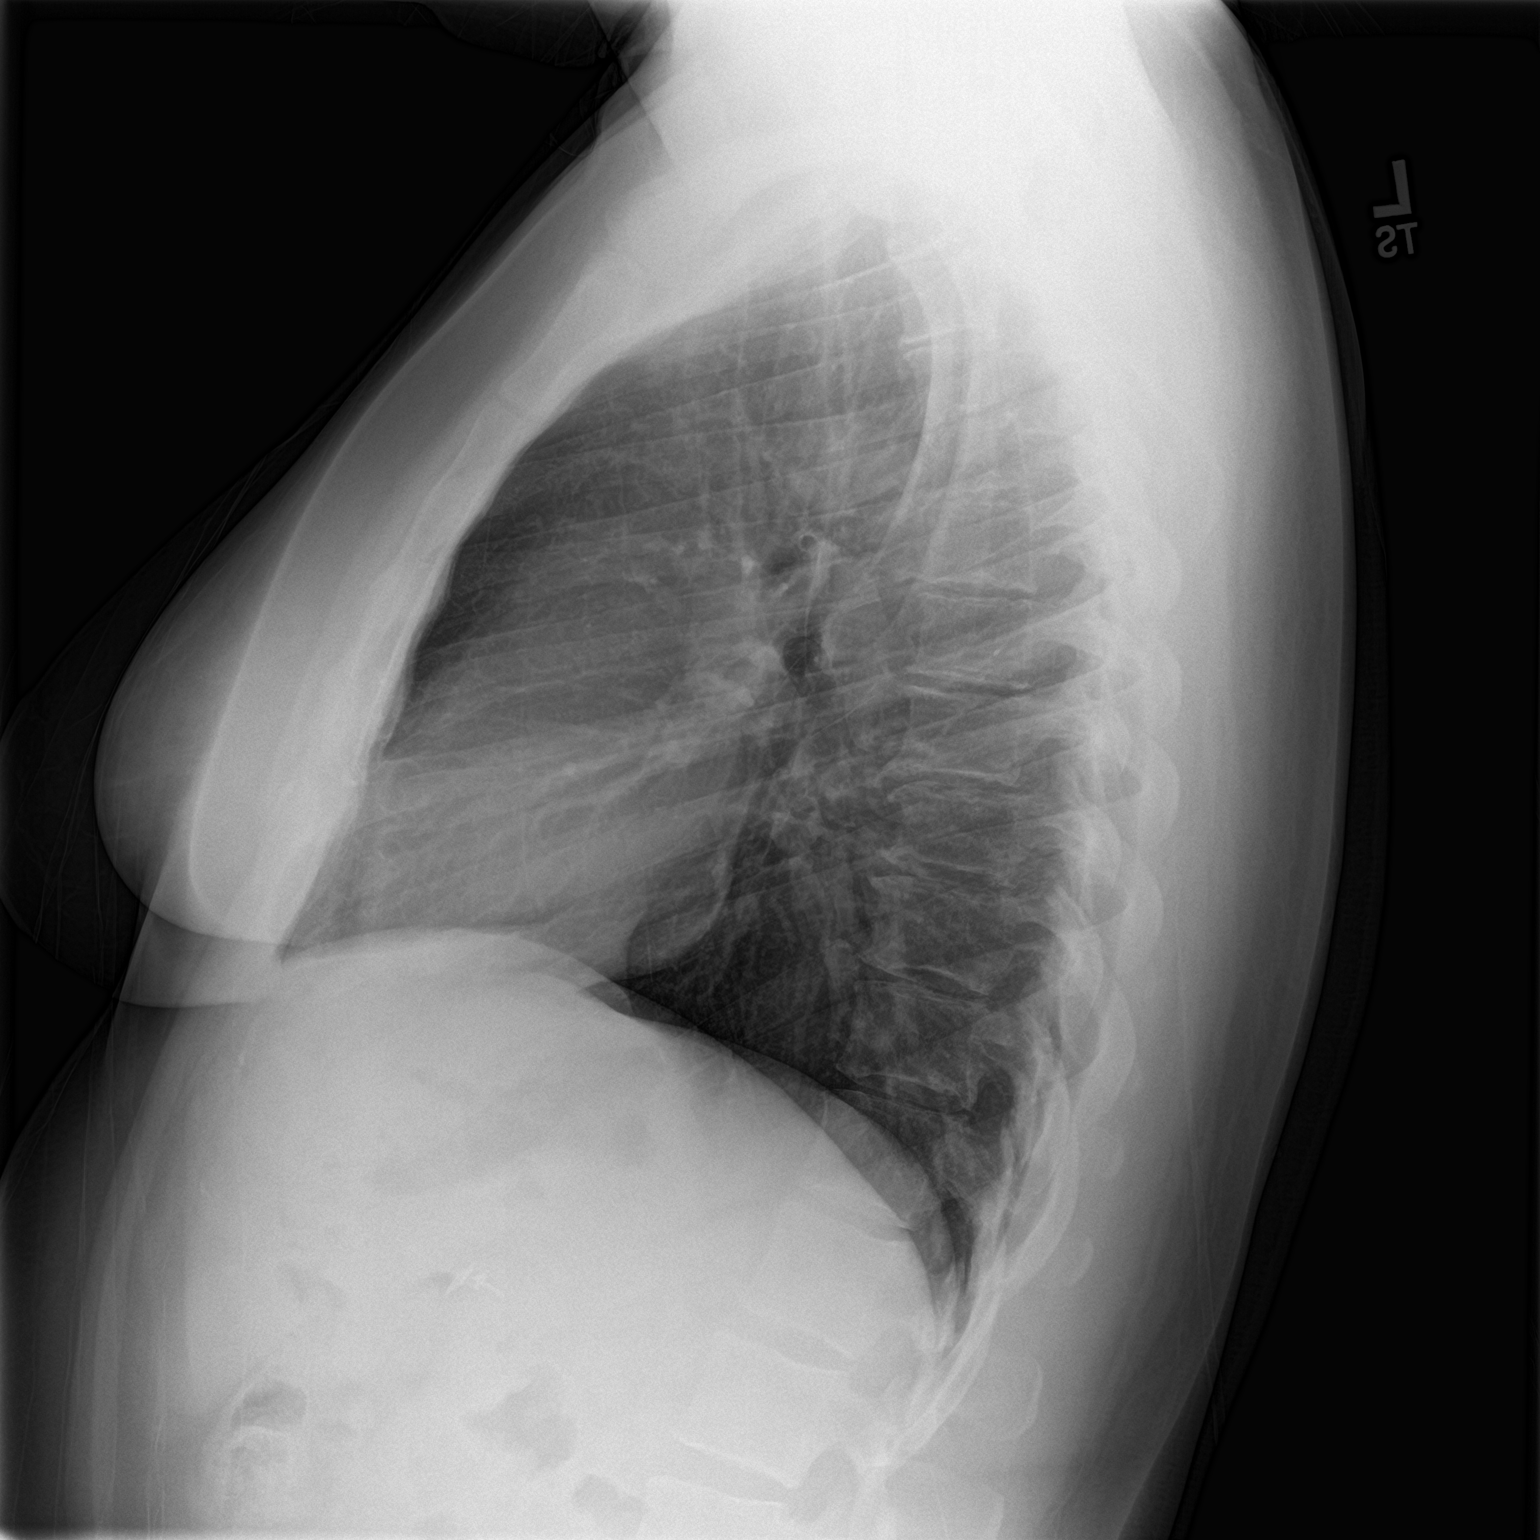

[2 of 2 positions shown; findings below may reference images not displayed]

FINDINGS: The lungs are adequately inflated. There is no focal infiltrate.
There is no pleural effusion. The heart and pulmonary vascularity
are normal. The mediastinum is normal in width. The bony thorax
exhibits no acute abnormality.
IMPRESSION: There is no pneumonia nor other acute cardiopulmonary abnormality.

## 2019-01-05 ENCOUNTER — Other Ambulatory Visit: Payer: Self-pay | Admitting: Physician Assistant

## 2019-01-05 DIAGNOSIS — J45909 Unspecified asthma, uncomplicated: Secondary | ICD-10-CM

## 2019-04-01 ENCOUNTER — Other Ambulatory Visit: Payer: Self-pay | Admitting: Physician Assistant

## 2019-04-01 DIAGNOSIS — J45909 Unspecified asthma, uncomplicated: Secondary | ICD-10-CM

## 2019-05-25 ENCOUNTER — Other Ambulatory Visit: Payer: Self-pay | Admitting: Physician Assistant

## 2019-05-25 DIAGNOSIS — J45909 Unspecified asthma, uncomplicated: Secondary | ICD-10-CM

## 2019-05-25 DIAGNOSIS — J3089 Other allergic rhinitis: Secondary | ICD-10-CM

## 2019-05-27 ENCOUNTER — Other Ambulatory Visit: Payer: Self-pay

## 2019-05-27 DIAGNOSIS — Z20822 Contact with and (suspected) exposure to covid-19: Secondary | ICD-10-CM

## 2019-05-28 LAB — NOVEL CORONAVIRUS, NAA: SARS-CoV-2, NAA: NOT DETECTED

## 2019-06-08 ENCOUNTER — Other Ambulatory Visit: Payer: Self-pay | Admitting: Physician Assistant

## 2019-06-08 DIAGNOSIS — J3089 Other allergic rhinitis: Secondary | ICD-10-CM

## 2019-06-08 DIAGNOSIS — J45909 Unspecified asthma, uncomplicated: Secondary | ICD-10-CM

## 2019-06-08 NOTE — Telephone Encounter (Signed)
Requested medication (s) are due for refill today: yes  Requested medication (s) are on the active medication list: yes  Last refill:  04/01/2019  Future visit scheduled: no  Notes to clinic: overdue for office visit    Requested Prescriptions  Pending Prescriptions Disp Refills   ADVAIR DISKUS 100-50 MCG/DOSE AEPB 180 each 0    Sig: TAKE 1 PUFF BY MOUTH TWICE A DAY     Pulmonology:  Combination Products Failed - 06/08/2019 11:23 AM      Failed - Valid encounter within last 12 months    Recent Outpatient Visits          1 year ago Annual physical exam   [facility] Carles Collet M, [place]   2 years ago Annual physical exam   [place], [place], [place]   2 years ago Encounter to establish care   Arnold, Adriana M, PA-C              albuterol (ACCUNEB) 1.25 MG/3ML nebulizer solution 75 mL 3    Sig: Take 3 mLs (1.25 mg total) by nebulization every 6 (six) hours as needed for wheezing.     Pulmonology:  Beta Agonists Failed - 06/08/2019 11:23 AM      Failed - One inhaler should last at least one month. If the patient is requesting refills earlier, contact the patient to check for uncontrolled symptoms.      Failed - Valid encounter within last 12 months    Recent Outpatient Visits          1 year ago Annual physical exam   [facility] Trinna Post, [place]   2 years ago Annual physical exam   [facility] Carles Collet M, [place]   2 years ago Encounter to establish care   [place], Adriana M, PA-C              montelukast (SINGULAIR) 10 MG tablet 90 tablet 3    Sig: TAKE 1 TABLET BY MOUTH EVERYDAY AT BEDTIME     Pulmonology:  Leukotriene Inhibitors Failed - 06/08/2019 11:23 AM      Failed - Valid encounter within last 12 months    Recent Outpatient Visits          1 year ago Annual physical exam   [facility] Trinna Post, [place]   2 years ago Annual physical exam   [facility] Trinna Post, [place]   2 years ago Encounter to establish care   [place], Wendee Beavers, [place]

## 2019-06-08 NOTE — Telephone Encounter (Signed)
Medication Refill - Medication:     albuterol (VENTOLIN HFA) 108 (90 Base) MCG/ACT inhaler ADVAIR DISKUS 100-50 MCG/DOSE AEPB montelukast (SINGULAIR) 10 MG tablet   Pt has CPE sched for 06/26/2019, and is requesting refills until that time. Please advise.  Preferred Pharmacy:  [id number] [place], [place] - [address] [place] [address]  Phone: [phone] Fax: [phone]     Pt was advised that RX refills may take up to 3 business days. We ask that you follow-up with your pharmacy.

## 2019-06-10 ENCOUNTER — Other Ambulatory Visit: Payer: Self-pay | Admitting: Physician Assistant

## 2019-06-10 DIAGNOSIS — G43109 Migraine with aura, not intractable, without status migrainosus: Secondary | ICD-10-CM

## 2019-06-10 NOTE — Telephone Encounter (Signed)
L.O.V. was on 05/21/2018.

## 2019-06-11 MED ORDER — MONTELUKAST SODIUM 10 MG PO TABS: ORAL_TABLET | ORAL | 3 refills | Status: DC

## 2019-06-11 MED ORDER — SUMATRIPTAN SUCCINATE 100 MG PO TABS: ORAL_TABLET | ORAL | 0 refills | Status: DC

## 2019-06-11 MED ORDER — ALBUTEROL SULFATE 1.25 MG/3ML IN NEBU: 1.0000 | INHALATION_SOLUTION | Freq: Four times a day (QID) | RESPIRATORY_TRACT | 3 refills | Status: DC | PRN

## 2019-06-11 MED ORDER — ADVAIR DISKUS 100-50 MCG/DOSE IN AEPB: INHALATION_SPRAY | RESPIRATORY_TRACT | 0 refills | Status: DC

## 2019-06-11 NOTE — Telephone Encounter (Signed)
Refilled today. Be sure to schedule CPE, can be in the new year if she is worried about covid.

## 2019-06-26 ENCOUNTER — Other Ambulatory Visit: Payer: Self-pay

## 2019-06-26 ENCOUNTER — Ambulatory Visit: Payer: BC Managed Care – PPO | Admitting: Physician Assistant

## 2019-06-26 DIAGNOSIS — Z0000 Encounter for general adult medical examination without abnormal findings: Secondary | ICD-10-CM | POA: Diagnosis not present

## 2019-06-26 DIAGNOSIS — G43109 Migraine with aura, not intractable, without status migrainosus: Secondary | ICD-10-CM

## 2019-06-26 DIAGNOSIS — J45909 Unspecified asthma, uncomplicated: Secondary | ICD-10-CM | POA: Diagnosis not present

## 2019-06-26 MED ORDER — ADVAIR DISKUS 100-50 MCG/DOSE IN AEPB: INHALATION_SPRAY | RESPIRATORY_TRACT | 3 refills | Status: DC

## 2019-06-26 MED ORDER — ALBUTEROL SULFATE HFA 108 (90 BASE) MCG/ACT IN AERS
INHALATION_SPRAY | RESPIRATORY_TRACT | 5 refills | Status: DC
Start: 2019-06-26 — End: 2020-03-22

## 2019-06-26 NOTE — Progress Notes (Signed)
Patient: Kim Thompson, Female    DOB: [birth year], 38 y.o.   MRN: [id number] Visit Date: 06/26/2019  Today's Provider: Trey SailorsAdriana M Seven Dollens, PA-C   Chief Complaint  Patient presents with  . Annual Exam   Subjective:  Kim Thompson is a 38 y.o. female who presents today for health maintenance and complete physical. She feels well. She reports exercising twice weekly. She reports she is sleeping well.  Virtual Visit via Video Note  I connected with Kim Thompson on 06/26/19 at 10:20 AM EST by a video enabled telemedicine application and verified that I am speaking with the correct person using two identifiers.   I discussed the limitations of evaluation and management by telemedicine and the availability of in person appointments. The patient expressed understanding and agreed to proceed.  The patient is being seen by electronic device due to some runny nose and mild shortness of breath.  Immunization History  Administered Date(s) Administered  . Influenza,inj,Quad PF,6+ Mos 04/01/2017  . Influenza-Unspecified 04/04/2018   04/01/17 Pap with HPV-Neg  Asthma: Currently taking advair daily and albuterol PRN.   Migraines: Currently taking imitrex PRN. One migraine every other month   She gets her labs checked at work every year at [employer]  Review of Systems  Constitutional: Negative.   HENT: Positive for rhinorrhea.   Eyes: Negative.   Respiratory: Positive for shortness of breath.   Cardiovascular: Negative.   Gastrointestinal: Negative.   Endocrine: Negative.   Genitourinary: Negative.   Musculoskeletal: Negative.   Skin: Negative.   Allergic/Immunologic: Positive for food allergies.  Neurological: Negative.   Hematological: Negative.   Psychiatric/Behavioral: Negative.     Social History   Socioeconomic History  . Marital status: Single    Spouse name: Not on file  . Number of children: Not on file  . Years of education: Not on file  . Highest education  level: Not on file  Occupational History  . Occupation: Full time    Employer: [employer]  Tobacco Use  . Smoking status: Never Smoker  . Smokeless tobacco: Never Used  Substance and Sexual Activity  . Alcohol use: Yes    Comment: Very Rarely  . Drug use: No  . Sexual activity: Yes    Partners: Male    Birth control/protection: Condom  Other Topics Concern  . Not on file  Social History Narrative  . Not on file   Social Determinants of Health   Financial Resource Strain:   . Difficulty of Paying Living Expenses: Not on file  Food Insecurity:   . Worried About Programme researcher, broadcasting/film/videounning Out of Food in the Last Year: Not on file  . Ran Out of Food in the Last Year: Not on file  Transportation Needs:   . Lack of Transportation (Medical): Not on file  . Lack of Transportation (Non-Medical): Not on file  Physical Activity:   . Days of Exercise per Week: Not on file  . Minutes of Exercise per Session: Not on file  Stress:   . Feeling of Stress : Not on file  Social Connections:   . Frequency of Communication with Friends and Family: Not on file  . Frequency of Social Gatherings with Friends and Family: Not on file  . Attends Religious Services: Not on file  . Active Member of Clubs or Organizations: Not on file  . Attends BankerClub or Organization Meetings: Not on file  . Marital Status: Not on file  Intimate Partner Violence:   . Fear  of Current or Ex-Partner: Not on file  . Emotionally Abused: Not on file  . Physically Abused: Not on file  . Sexually Abused: Not on file    Patient Active Problem List   Diagnosis Date Noted  . Asthma 01/30/2017  . Heartburn 01/30/2017  . Migraine with aura 01/30/2017  . Family history of colon cancer 01/30/2017    Past Surgical History:  Procedure Laterality Date  . ADENOIDECTOMY  1994  . CHOLECYSTECTOMY  2003  . MYRINGOTOMY WITH TUBE PLACEMENT Bilateral    X's three times.    Her family history includes Aortic aneurysm in her maternal  grandfather; Asthma in her brother, father, and mother; Breast cancer in her paternal grandmother; Colon cancer in her maternal uncle; Diabetes in her maternal uncle and paternal grandmother; Healthy in her maternal grandmother; Heart attack in her paternal grandmother; Hypertension in her paternal grandfather; Irregular heart beat in her mother; Osteoporosis in her maternal grandmother; Stroke in her paternal grandfather and paternal grandmother.     Outpatient Encounter Medications as of 06/26/2019  Medication Sig  . ADVAIR DISKUS 100-50 MCG/DOSE AEPB TAKE 1 PUFF BY MOUTH TWICE A DAY  . albuterol (ACCUNEB) 1.25 MG/3ML nebulizer solution Take 3 mLs (1.25 mg total) by nebulization every 6 (six) hours as needed for wheezing.  Marland Kitchen albuterol (VENTOLIN HFA) 108 (90 Base) MCG/ACT inhaler INHALE 2 PUFFS EVERY 4 HRS. AS NEEDED  . fexofenadine (ALLEGRA) 180 MG tablet TAKE 1 TABLET BY MOUTH EVERY DAY  . montelukast (SINGULAIR) 10 MG tablet TAKE 1 TABLET BY MOUTH EVERYDAY AT BEDTIME  . omeprazole (PRILOSEC) 20 MG capsule Take 20 mg by mouth daily.  . SUMAtriptan (IMITREX) 100 MG tablet TAKE 1 TABLET - MAY REPEAT IN 2 HOURS IF HEADACHE PERSISTS OR RECURS.   No facility-administered encounter medications on file as of 06/26/2019.    Patient Care Team: Paulene Floor as PCP - General (Physician Assistant)      Objective:   Vitals: There were no vitals filed for this visit.  Physical Exam Constitutional:      Appearance: Normal appearance. She is well-developed.  Neck:     Meningeal: Brudzinski's sign and Kernig's sign absent.  Pulmonary:     Effort: Pulmonary effort is normal. No respiratory distress.  Musculoskeletal:     Cervical back: Full passive range of motion without pain.  Neurological:     Mental Status: She is alert. She is not disoriented.     Motor: No abnormal muscle tone.     Deep Tendon Reflexes: Reflexes are normal and symmetric. Babinski sign absent on the right side.  Babinski sign absent on the left side.     Reflex Scores:      Tricep reflexes are 2+ on the right side and 2+ on the left side.      Bicep reflexes are 2+ on the right side and 2+ on the left side.      Brachioradialis reflexes are 2+ on the right side and 2+ on the left side.      Patellar reflexes are 2+ on the right side and 2+ on the left side.      Achilles reflexes are 2+ on the right side and 2+ on the left side. Psychiatric:        Mood and Affect: Mood normal.        Behavior: Behavior normal.     Depression Screen PHQ 2/9 Scores 06/26/2019 05/21/2018 04/01/2017 02/01/2017  PHQ - 2 Score 0 0  0 1  PHQ- 9 Score - 1 1 3     Assessment & Plan:     Routine Health Maintenance and Physical Exam  Exercise Activities and Dietary recommendations Goals   None     Immunization History  Administered Date(s) Administered  . Influenza,inj,Quad PF,6+ Mos 04/01/2017  . Influenza-Unspecified 04/04/2018    Health Maintenance  Topic Date Due  . HIV Screening  09/04/1997  . PAP SMEAR-Modifier  04/01/2020  . TETANUS/TDAP  02/06/2025  . INFLUENZA VACCINE  Completed     Discussed health benefits of physical activity, and encouraged her to engage in regular exercise appropriate for her age and condition.    1. Annual physical exam  - CBC with Differential/Platelet - Comprehensive metabolic panel - Lipid Panel With LDL/HDL Ratio - TSH  2. Asthma, unspecified asthma severity, unspecified whether complicated, unspecified whether persistent  - ADVAIR DISKUS 100-50 MCG/DOSE AEPB; TAKE 1 PUFF BY MOUTH TWICE A DAY  Dispense: 180 each; Refill: 3 - albuterol (VENTOLIN HFA) 108 (90 Base) MCG/ACT inhaler; INHALE 2 PUFFS EVERY 4 HRS. AS NEEDED  Dispense: 6.7 g; Refill: 5  3. Migraine with aura and without status migrainosus, not intractable  The entirety of the information documented in the History of Present Illness, Review of Systems and Physical Exam were personally obtained by me.  Portions of this information were initially documented by 04/08/2025, CMA and reviewed by me for thoroughness and accuracy.   F/u 1 year CPE and chronic

## 2019-06-26 NOTE — Patient Instructions (Signed)
Asthma, Adult ° °Asthma is a long-term (chronic) condition in which the airways get tight and narrow. The airways are the breathing passages that lead from the nose and mouth down into the lungs. A person with asthma will have times when symptoms get worse. These are called asthma attacks. They can cause coughing, whistling sounds when you breathe (wheezing), shortness of breath, and chest pain. They can make it hard to breathe. There is no cure for asthma, but medicines and lifestyle changes can help control it. °There are many things that can bring on an asthma attack or make asthma symptoms worse (triggers). Common triggers include: °· Mold. °· Dust. °· Cigarette smoke. °· Cockroaches. °· Things that can cause allergy symptoms (allergens). These include animal skin flakes (dander) and pollen from trees or grass. °· Things that pollute the air. These may include household cleaners, wood smoke, smog, or chemical odors. °· Cold air, weather changes, and wind. °· Crying or laughing hard. °· Stress. °· Certain medicines or drugs. °· Certain foods such as dried fruit, potato chips, and grape juice. °· Infections, such as a cold or the flu. °· Certain medical conditions or diseases. °· Exercise or tiring activities. °Asthma may be treated with medicines and by staying away from the things that cause asthma attacks. Types of medicines may include: °· Controller medicines. These help prevent asthma symptoms. They are usually taken every day. °· Fast-acting reliever or rescue medicines. These quickly relieve asthma symptoms. They are used as needed and provide short-term relief. °· Allergy medicines if your attacks are brought on by allergens. °· Medicines to help control the body's defense (immune) system. °Follow these instructions at home: °Avoiding triggers in your home °· Change your heating and air conditioning filter often. °· Limit your use of fireplaces and wood stoves. °· Get rid of pests (such as roaches and  mice) and their droppings. °· Throw away plants if you see mold on them. °· Clean your floors. Dust regularly. Use cleaning products that do not smell. °· Have someone vacuum when you are not home. Use a vacuum cleaner with a HEPA filter if possible. °· Replace carpet with wood, tile, or vinyl flooring. Carpet can trap animal skin flakes and dust. °· Use allergy-proof pillows, mattress covers, and box spring covers. °· Wash bed sheets and blankets every week in hot water. Dry them in a dryer. °· Keep your bedroom free of any triggers. °· Avoid pets and keep windows closed when things that cause allergy symptoms are in the air. °· Use blankets that are made of polyester or cotton. °· Clean bathrooms and kitchens with bleach. If possible, have someone repaint the walls in these rooms with mold-resistant paint. Keep out of the rooms that are being cleaned and painted. °· Wash your hands often with soap and water. If soap and water are not available, use hand sanitizer. °· Do not allow anyone to smoke in your home. °General instructions °· Take over-the-counter and prescription medicines only as told by your doctor. °? Talk with your doctor if you have questions about how or when to take your medicines. °? Make note if you need to use your medicines more often than usual. °· Do not use any products that contain nicotine or tobacco, such as cigarettes and e-cigarettes. If you need help quitting, ask your doctor. °· Stay away from secondhand smoke. °· Avoid doing things outdoors when allergen counts are high and when air quality is low. °· Wear a ski mask   when doing outdoor activities in the winter. The mask should cover your nose and mouth. Exercise indoors on cold days if you can. °· Warm up before you exercise. Take time to cool down after exercise. °· Use a peak flow meter as told by your doctor. A peak flow meter is a tool that measures how well the lungs are working. °· Keep track of the peak flow meter's readings.  Write them down. °· Follow your asthma action plan. This is a written plan for taking care of your asthma and treating your attacks. °· Make sure you get all the shots (vaccines) that your doctor recommends. Ask your doctor about a flu shot and a pneumonia shot. °· Keep all follow-up visits as told by your doctor. This is important. °Contact a doctor if: °· You have wheezing, shortness of breath, or a cough even while taking medicine to prevent attacks. °· The mucus you cough up (sputum) is thicker than usual. °· The mucus you cough up changes from clear or white to yellow, green, gray, or bloody. °· You have problems from the medicine you are taking, such as: °? A rash. °? Itching. °? Swelling. °? Trouble breathing. °· You need reliever medicines more than 2-3 times a week. °· Your peak flow reading is still at 50-79% of your personal best after following the action plan for 1 hour. °· You have a fever. °Get help right away if: °· You seem to be worse and are not responding to medicine during an asthma attack. °· You are short of breath even at rest. °· You get short of breath when doing very little activity. °· You have trouble eating, drinking, or talking. °· You have chest pain or tightness. °· You have a fast heartbeat. °· Your lips or fingernails start to turn blue. °· You are light-headed or dizzy, or you faint. °· Your peak flow is less than 50% of your personal best. °· You feel too tired to breathe normally. °Summary °· Asthma is a long-term (chronic) condition in which the airways get tight and narrow. An asthma attack can make it hard to breathe. °· Asthma cannot be cured, but medicines and lifestyle changes can help control it. °· Make sure you understand how to avoid triggers and how and when to use your medicines. °This information is not intended to replace advice given to you by your health care provider. Make sure you discuss any questions you have with your health care provider. °Document  Released: 12/12/2007 Document Revised: 08/28/2018 Document Reviewed: 07/30/2016 °Elsevier Patient Education © 2020 [employer] Inc[employer] ° °

## 2019-08-20 DIAGNOSIS — Z0189 Encounter for other specified special examinations: Secondary | ICD-10-CM | POA: Diagnosis not present

## 2019-08-25 DIAGNOSIS — Z713 Dietary counseling and surveillance: Secondary | ICD-10-CM | POA: Diagnosis not present

## 2019-08-25 DIAGNOSIS — Z043 Encounter for examination and observation following other accident: Secondary | ICD-10-CM | POA: Diagnosis not present

## 2019-10-14 DIAGNOSIS — J309 Allergic rhinitis, unspecified: Secondary | ICD-10-CM | POA: Diagnosis not present

## 2019-10-14 DIAGNOSIS — H1131 Conjunctival hemorrhage, right eye: Secondary | ICD-10-CM | POA: Diagnosis not present

## 2020-02-09 DIAGNOSIS — J208 Acute bronchitis due to other specified organisms: Secondary | ICD-10-CM | POA: Diagnosis not present

## 2020-02-11 DIAGNOSIS — Z20822 Contact with and (suspected) exposure to covid-19: Secondary | ICD-10-CM | POA: Diagnosis not present

## 2020-02-14 ENCOUNTER — Other Ambulatory Visit: Payer: Self-pay

## 2020-02-14 ENCOUNTER — Ambulatory Visit (INDEPENDENT_AMBULATORY_CARE_PROVIDER_SITE_OTHER): Payer: BC Managed Care – PPO

## 2020-02-14 ENCOUNTER — Encounter (HOSPITAL_COMMUNITY): Payer: Self-pay

## 2020-02-14 ENCOUNTER — Ambulatory Visit (HOSPITAL_COMMUNITY)
Admission: EM | Admit: 2020-02-14 | Discharge: 2020-02-14 | Disposition: A | Discharge status: Home / Self Care | Payer: BC Managed Care – PPO | Attending: Emergency Medicine | Admitting: Emergency Medicine

## 2020-02-14 DIAGNOSIS — J4520 Mild intermittent asthma, uncomplicated: Secondary | ICD-10-CM

## 2020-02-14 DIAGNOSIS — R0600 Dyspnea, unspecified: Secondary | ICD-10-CM | POA: Diagnosis not present

## 2020-02-14 DIAGNOSIS — J4521 Mild intermittent asthma with (acute) exacerbation: Secondary | ICD-10-CM | POA: Diagnosis not present

## 2020-02-14 DIAGNOSIS — R05 Cough: Secondary | ICD-10-CM | POA: Diagnosis not present

## 2020-02-14 DIAGNOSIS — J189 Pneumonia, unspecified organism: Secondary | ICD-10-CM | POA: Diagnosis not present

## 2020-02-14 MED ORDER — DOXYCYCLINE HYCLATE 100 MG PO CAPS: 100.0000 mg | ORAL_CAPSULE | Freq: Two times a day (BID) | ORAL | 0 refills | Status: AC

## 2020-02-14 MED ORDER — PREDNISONE 20 MG PO TABS: 40.0000 mg | ORAL_TABLET | Freq: Every day | ORAL | 0 refills | Status: AC

## 2020-02-14 NOTE — ED Provider Notes (Signed)
[facility]    CSN: [id number] Arrival date & time: 02/14/20  1128      History   Chief Complaint Chief Complaint  Patient presents with   Cough   Shortness of Breath    HPI Kim Thompson is a 39 y.o. female.   Kim Thompson presents with complaints of symptoms which started 8 days ago, which have included fever, shortness of breath, and cough.  Was started on azithromycin 8/3 and tessolon for cough, via telemedicine visit. Did have facial pain at the time- this has resolved. Temp has improved some but has still noted low grade temps, now around 99/100. 8/5 covid tested by PCR which was negative. This morning felt more shortness of breath. Albuterol is not helping. Has history of asthma, as well as history of pneumonia/ bronchitis in the past. No known ill contacts. Does work in an office, wears mask. She is covid vaccinated. Cough is productive. Endorses some headache, back ache from coughing. Today took a combo medication which had tylenol.    ROS per HPI, negative if not otherwise mentioned.      History reviewed. No pertinent past medical history.  Patient Active Problem List   Diagnosis Date Noted   Asthma 01/30/2017   Heartburn 01/30/2017   Migraine with aura 01/30/2017   Family history of colon cancer 01/30/2017    Past Surgical History:  Procedure Laterality Date   ADENOIDECTOMY  1994   CHOLECYSTECTOMY  2003   MYRINGOTOMY WITH TUBE PLACEMENT Bilateral    X's three times.    OB History    Gravida  0   Para  0   Term  0   Preterm  0   AB  0   Living  0     SAB  0   TAB  0   Ectopic  0   Multiple  0   Live Births  0            Home Medications    Prior to Admission medications   Medication Sig Start Date End Date Taking? Authorizing Provider  ADVAIR DISKUS 100-50 MCG/DOSE AEPB TAKE 1 PUFF BY MOUTH TWICE A DAY 06/26/19   Osvaldo Angst M, PA-C  albuterol (ACCUNEB) 1.25 MG/3ML nebulizer solution Take 3 mLs  (1.25 mg total) by nebulization every 6 (six) hours as needed for wheezing. 06/11/19   Trey Sailors, PA-C  albuterol (VENTOLIN HFA) 108 (90 Base) MCG/ACT inhaler INHALE 2 PUFFS EVERY 4 HRS. AS NEEDED 06/26/19   Trey Sailors, PA-C  doxycycline (VIBRAMYCIN) 100 MG capsule Take 1 capsule (100 mg total) by mouth 2 (two) times daily for 7 days. 02/14/20 02/21/20  Georgetta Haber, NP  fexofenadine (ALLEGRA) 180 MG tablet TAKE 1 TABLET BY MOUTH EVERY DAY 05/03/17   Osvaldo Angst M, PA-C  montelukast (SINGULAIR) 10 MG tablet TAKE 1 TABLET BY MOUTH EVERYDAY AT BEDTIME 06/11/19   Osvaldo Angst M, PA-C  omeprazole (PRILOSEC) 20 MG capsule Take 20 mg by mouth daily.    [provider]  predniSONE (DELTASONE) 20 MG tablet Take 2 tablets (40 mg total) by mouth daily with breakfast for 5 days. 02/14/20 02/19/20  Georgetta Haber, NP  SUMAtriptan (IMITREX) 100 MG tablet TAKE 1 TABLET - MAY REPEAT IN 2 HOURS IF HEADACHE PERSISTS OR RECURS. 06/11/19   Trey Sailors, PA-C    Family History Family History  Problem Relation Age of Onset   Irregular heart beat Mother  Asthma Mother    Asthma Father    Asthma Brother    Healthy Maternal Grandmother    Osteoporosis Maternal Grandmother    Aortic aneurysm Maternal Grandfather    Diabetes Paternal Grandmother    Breast cancer Paternal Grandmother    Heart attack Paternal Grandmother    Stroke Paternal Grandmother    Hypertension Paternal Grandfather    Stroke Paternal Grandfather    Colon cancer Maternal Uncle    Diabetes Maternal Uncle     Social History Social History   Tobacco Use   Smoking status: Never Smoker   Smokeless tobacco: Never Used  Building services engineer Use: Never used  Substance Use Topics   Alcohol use: Yes    Comment: Very Rarely   Drug use: No     Allergies   Augmentin [amoxicillin-pot clavulanate] and Sulfa antibiotics   Review of Systems Review of Systems   Physical Exam Triage  Vital Signs ED Triage Vitals  Enc Vitals Group     BP 02/14/20 1141 (!) 134/94     Pulse Rate 02/14/20 1141 (!) 113     Resp 02/14/20 1141 16     Temp 02/14/20 1141 98.2 F (36.8 C)     Temp Source 02/14/20 1141 Oral     SpO2 02/14/20 1141 97 %     Weight --      Height --      Head Circumference --      Peak Flow --      Pain Score 02/14/20 1144 0     Pain Loc --      Pain Edu? --      Excl. in GC? --    No data found.  Updated Vital Signs BP (!) 134/94 (BP Location: Right Arm)    Pulse (!) 113    Temp 98.2 F (36.8 C) (Oral)    Resp 16    LMP 01/24/2020    SpO2 97%   Visual Acuity Right Eye Distance:   Left Eye Distance:   Bilateral Distance:    Right Eye Near:   Left Eye Near:    Bilateral Near:     Physical Exam Constitutional:      General: She is not in acute distress.    Appearance: She is well-developed.  Cardiovascular:     Rate and Rhythm: Tachycardia present.  Pulmonary:     Effort: Pulmonary effort is normal. No tachypnea, accessory muscle usage or respiratory distress.     Breath sounds: Examination of the right-middle field reveals decreased breath sounds. Examination of the right-lower field reveals decreased breath sounds. Decreased breath sounds present. No wheezing.  Skin:    General: Skin is warm and dry.  Neurological:     Mental Status: She is alert and oriented to person, place, and time.      UC Treatments / Results  Labs (all labs ordered are listed, but only abnormal results are displayed) Labs Reviewed - No data to display  EKG   Radiology DG Chest 2 View  Result Date: 02/14/2020 CLINICAL DATA:  Cough, dyspnea EXAM: CHEST - 2 VIEW COMPARISON:  06/24/2017 chest radiograph. FINDINGS: Stable cardiomediastinal silhouette with normal heart size. No pneumothorax. No pleural effusion. Faint hazy right middle lobe opacity. IMPRESSION: Faint hazy right middle lobe opacity, cannot exclude early pneumonia. Electronically Signed   By: Delbert Phenix M.D.   On: 02/14/2020 12:27    Procedures Procedures (including critical care time)  Medications Ordered in UC  Medications - No data to display  Initial Impression / Assessment and Plan / UC Course  I have reviewed the triage vital signs and the nursing notes.  Pertinent labs & imaging results that were available during my care of the patient were reviewed by me and considered in my medical decision making (see chart for details).     Worsening shortness of breath. History of asthma. Chest xray with possible early pneumonia. Minimal improvement with azithromycin, augmentin allergy, therefore doxycycline provided. Patient would like to hold off on prednisone which seems reasonable, to start antibiotics for a few days, will use prendisone prn. Inhaler prn. Return precautions provided. Patient verbalized understanding and agreeable to plan.   Final Clinical Impressions(s) / UC Diagnoses   Final diagnoses:  Community acquired pneumonia of right middle lobe of lung  Mild intermittent asthma without complication     Discharge Instructions     Push fluids to ensure adequate hydration and keep secretions thin.  Tylenol and/or ibuprofen as needed for pain or fevers.  Decongestants and over the counter medications as needed for symptoms.  Complete course of antibiotics.   If no improvement of cough/ shortness of breath over the next few days after starting additional antibiotics may use steroids as well. If improvement you do not have to take these.  Use of inhaler as needed for wheezing or shortness of breath.   If symptoms worsen or do not improve in the next week to return to be seen or to follow up with your PCP.      ED Prescriptions    Medication Sig Dispense Auth. Provider   doxycycline (VIBRAMYCIN) 100 MG capsule Take 1 capsule (100 mg total) by mouth 2 (two) times daily for 7 days. 14 capsule Linus Mako B, NP   predniSONE (DELTASONE) 20 MG tablet Take 2 tablets (40 mg  total) by mouth daily with breakfast for 5 days. 10 tablet Georgetta Haber, NP     PDMP not reviewed this encounter.   Georgetta Haber, NP 02/14/20 1314

## 2020-02-14 NOTE — ED Triage Notes (Signed)
Pt presents to UC for productive cough, and shortness of breath. Pt states she has had URI symptoms x1week, received and finished z-pack with minimal symptom improvement. Pt being followed by PCP via telehealth and told to come in for SOB. Pt also endorsing intermittent fevers.   Pt able to speak in complete sentences, with minimal accessory muscle use, sating 97 on RA. Pt has treated with home albuterol inhaler.   Pt has had negative covid PCR on Thursday.

## 2020-02-14 NOTE — Discharge Instructions (Addendum)
Push fluids to ensure adequate hydration and keep secretions thin.  Tylenol and/or ibuprofen as needed for pain or fevers.  Decongestants and over the counter medications as needed for symptoms.  Complete course of antibiotics.   If no improvement of cough/ shortness of breath over the next few days after starting additional antibiotics may use steroids as well. If improvement you do not have to take these.  Use of inhaler as needed for wheezing or shortness of breath.   If symptoms worsen or do not improve in the next week to return to be seen or to follow up with your PCP.

## 2020-02-22 DIAGNOSIS — J45998 Other asthma: Secondary | ICD-10-CM | POA: Diagnosis not present

## 2020-03-03 ENCOUNTER — Other Ambulatory Visit: Payer: Self-pay | Admitting: Physician Assistant

## 2020-03-03 DIAGNOSIS — G43109 Migraine with aura, not intractable, without status migrainosus: Secondary | ICD-10-CM

## 2020-03-22 ENCOUNTER — Other Ambulatory Visit: Payer: Self-pay | Admitting: Physician Assistant

## 2020-03-22 DIAGNOSIS — J45909 Unspecified asthma, uncomplicated: Secondary | ICD-10-CM

## 2020-03-22 NOTE — Telephone Encounter (Signed)
Requested Prescriptions  Pending Prescriptions Disp Refills  . albuterol (VENTOLIN HFA) 108 (90 Base) MCG/ACT inhaler [Pharmacy Med Name: ALBUTEROL HFA (PROVENTIL) INH] 6.7 each 3    Sig: INHALE 2 PUFFS BY MOUTH EVERY 4 HOURS AS NEEDED     Pulmonology:  Beta Agonists Failed - 03/22/2020  1:21 AM      Failed - One inhaler should last at least one month. If the patient is requesting refills earlier, contact the patient to check for uncontrolled symptoms.      Passed - Valid encounter within last 12 months    Recent Outpatient Visits          9 months ago Annual physical exam   [facility] Trey Sailors, [place]   1 year ago Annual physical exam   [facility] Trey Sailors, [place]   2 years ago Annual physical exam   [facility] Osvaldo Angst M, [place]   3 years ago Encounter to establish care   [facility] [place], Lavella Hammock, [place]

## 2020-04-18 DIAGNOSIS — Z23 Encounter for immunization: Secondary | ICD-10-CM | POA: Diagnosis not present

## 2020-05-24 ENCOUNTER — Other Ambulatory Visit: Payer: Self-pay | Admitting: Physician Assistant

## 2020-05-24 DIAGNOSIS — J45909 Unspecified asthma, uncomplicated: Secondary | ICD-10-CM

## 2020-05-24 DIAGNOSIS — J3089 Other allergic rhinitis: Secondary | ICD-10-CM

## 2020-05-25 ENCOUNTER — Encounter (HOSPITAL_COMMUNITY): Payer: Self-pay | Admitting: *Deleted

## 2020-05-25 ENCOUNTER — Ambulatory Visit (HOSPITAL_COMMUNITY)
Admission: EM | Admit: 2020-05-25 | Discharge: 2020-05-25 | Disposition: A | Discharge status: Home / Self Care | Payer: BC Managed Care – PPO | Attending: Emergency Medicine | Admitting: Emergency Medicine

## 2020-05-25 ENCOUNTER — Ambulatory Visit
Admission: EM | Admit: 2020-05-25 | Discharge: 2020-05-25 | Disposition: A | Discharge status: Home / Self Care | Payer: BLUE CROSS/BLUE SHIELD

## 2020-05-25 ENCOUNTER — Other Ambulatory Visit: Payer: Self-pay

## 2020-05-25 DIAGNOSIS — J0140 Acute pansinusitis, unspecified: Secondary | ICD-10-CM

## 2020-05-25 DIAGNOSIS — R197 Diarrhea, unspecified: Secondary | ICD-10-CM | POA: Diagnosis not present

## 2020-05-25 DIAGNOSIS — Z79899 Other long term (current) drug therapy: Secondary | ICD-10-CM | POA: Diagnosis not present

## 2020-05-25 DIAGNOSIS — K0889 Other specified disorders of teeth and supporting structures: Secondary | ICD-10-CM | POA: Diagnosis not present

## 2020-05-25 DIAGNOSIS — Z20822 Contact with and (suspected) exposure to covid-19: Secondary | ICD-10-CM | POA: Insufficient documentation

## 2020-05-25 DIAGNOSIS — Z882 Allergy status to sulfonamides status: Secondary | ICD-10-CM | POA: Insufficient documentation

## 2020-05-25 DIAGNOSIS — J4541 Moderate persistent asthma with (acute) exacerbation: Secondary | ICD-10-CM

## 2020-05-25 DIAGNOSIS — Z881 Allergy status to other antibiotic agents status: Secondary | ICD-10-CM | POA: Insufficient documentation

## 2020-05-25 DIAGNOSIS — H9203 Otalgia, bilateral: Secondary | ICD-10-CM | POA: Insufficient documentation

## 2020-05-25 DIAGNOSIS — Z880 Allergy status to penicillin: Secondary | ICD-10-CM | POA: Diagnosis not present

## 2020-05-25 DIAGNOSIS — M26623 Arthralgia of bilateral temporomandibular joint: Secondary | ICD-10-CM | POA: Diagnosis not present

## 2020-05-25 DIAGNOSIS — R0602 Shortness of breath: Secondary | ICD-10-CM | POA: Diagnosis not present

## 2020-05-25 LAB — POC URINE PREG, ED: Preg Test, Ur: NEGATIVE

## 2020-05-25 LAB — SARS CORONAVIRUS 2 (TAT 6-24 HRS): SARS Coronavirus 2: NEGATIVE

## 2020-05-25 MED ORDER — DOXYCYCLINE HYCLATE 100 MG PO CAPS
100.0000 mg | ORAL_CAPSULE | Freq: Two times a day (BID) | ORAL | 0 refills | Status: AC
Start: 2020-05-25 — End: 2020-06-04

## 2020-05-25 MED ORDER — AEROCHAMBER PLUS MISC
2 refills | Status: AC
Start: 2020-05-25 — End: ?

## 2020-05-25 MED ORDER — CYCLOBENZAPRINE HCL 10 MG PO TABS
10.0000 mg | ORAL_TABLET | Freq: Every day | ORAL | 0 refills | Status: DC
Start: 2020-05-25 — End: 2020-09-08

## 2020-05-25 MED ORDER — DEXAMETHASONE 4 MG PO TABS
ORAL_TABLET | ORAL | 0 refills | Status: DC
Start: 2020-05-25 — End: 2020-09-08

## 2020-05-25 MED ORDER — ALBUTEROL SULFATE HFA 108 (90 BASE) MCG/ACT IN AERS: 1.0000 | INHALATION_SPRAY | RESPIRATORY_TRACT | 0 refills | Status: DC | PRN

## 2020-05-25 MED ORDER — IBUPROFEN 600 MG PO TABS
600.0000 mg | ORAL_TABLET | Freq: Four times a day (QID) | ORAL | 0 refills | Status: DC | PRN
Start: 2020-05-25 — End: 2021-12-12

## 2020-05-25 MED ORDER — FLUTICASONE PROPIONATE 50 MCG/ACT NA SUSP
2.0000 | Freq: Every day | NASAL | 0 refills | Status: DC
Start: 2020-05-25 — End: 2022-05-15

## 2020-05-25 NOTE — Discharge Instructions (Addendum)
Asthma: regularly scheduled albuterol for the next several days.  2 puffs every 4 hours for 2 days, then 2 puffs every 6 hours for 2 days, then as needed.   dexamethasone 16 mg for 2 days.  Otalgia/TMJ arthralgia: 600 mg of ibuprofen combined with 1000 mg of Tylenol together three or four times a day as needed.  Soft diet.  Flexeril at night.  Continue wearing invisialign.  A bite guard may be more efficient.  Sinusitis.  We will contact you if your Covid comes back positive.  You will be a candidate for monoclonal antibody infusion.  Try saline nasal irrigation with a Lloyd Huger med rinse and distilled water as often as you want, Flonase, Mucinex D.  If that does not work, then you can do Allegra-D, Claritin-D or Allegra-D.  Wait-and-see prescription for doxycycline.  I would wait for 3 to 5 days before starting this.

## 2020-05-25 NOTE — ED Triage Notes (Signed)
PT reports  HA , asthma unrelieved with neb treatment used last night. Pt also used rescue inhaler this Am . No relief of SX's/ Pt reports facial pain without swelling and bil ear pain.

## 2020-05-25 NOTE — ED Provider Notes (Signed)
HPI  SUBJECTIVE:  Kim Thompson is a 39 y.o. female who presents with multiple issues.  First, she reports having a "asthma attack" since Sunday which started while hiking.  She states it got worse last night.  She reports chest tightness, shortness of breath, wheezing, cough that is consistent with previous asthma exacerbations.  No fevers.  Questionable allergy symptoms with itchy eyes.  She has tried her albuterol nebulizer with significant improvement in her symptoms.  She is requiring her rescue inhaler multiple times a day, has used it three times today.  She does not have a spacer with it.  She states that she is compliant with her maintenance asthma medications.  Her symptoms are worse with going out in the cold air, heavy exertion.  Second, she reports bilateral ear pain, decreased hearing, nasal congestion, sinus pain and pressure, clear rhinorrhea, postnasal drip, upper dental pain starting today.  No otorrhea, facial swelling.  No fevers.  No body aches, other headaches, sore throat, loss of sense of smell or taste, nausea, vomiting, abdominal pain.  She reports some diarrhea.  No known Covid or flu exposure.  She got the second dose of Covid vaccine in April.  She has also gotten a flu vaccine.  States that she grinds/clenches her teeth.  She has tried Mucinex without improvement in her symptoms.  No aggravating factors.  No antibiotics in the past month.  No antipyretic in the past 6 hours.  She has a past medical history of asthma, last admitted 10 years ago.  No intubations.  She has a history of recurrent otitis media stent post tympanoplasty, pneumonia in August.  No history of diabetes, hypertension.  LMP: 10/21.  [id number], Lavella Hammock, PA-C   History reviewed. No pertinent past medical history.  Past Surgical History:  Procedure Laterality Date  . ADENOIDECTOMY  1994  . CHOLECYSTECTOMY  2003  . MYRINGOTOMY WITH TUBE PLACEMENT Bilateral    X's three times.    Family History   Problem Relation Age of Onset  . Irregular heart beat Mother   . Asthma Mother   . Asthma Father   . Asthma Brother   . Healthy Maternal Grandmother   . Osteoporosis Maternal Grandmother   . Aortic aneurysm Maternal Grandfather   . Diabetes Paternal Grandmother   . Breast cancer Paternal Grandmother   . Heart attack Paternal Grandmother   . Stroke Paternal Grandmother   . Hypertension Paternal Grandfather   . Stroke Paternal Grandfather   . Colon cancer Maternal Uncle   . Diabetes Maternal Uncle     Social History   Tobacco Use  . Smoking status: Never Smoker  . Smokeless tobacco: Never Used  Vaping Use  . Vaping Use: Never used  Substance Use Topics  . Alcohol use: Yes    Comment: Very Rarely  . Drug use: No    No current facility-administered medications for this encounter.  Current Outpatient Medications:  .  ADVAIR DISKUS 100-50 MCG/DOSE AEPB, TAKE 1 PUFF BY MOUTH TWICE A DAY, Disp: 180 each, Rfl: 3 .  montelukast (SINGULAIR) 10 MG tablet, TAKE 1 TABLET BY MOUTH EVERYDAY AT BEDTIME, Disp: 90 tablet, Rfl: 0 .  omeprazole (PRILOSEC) 20 MG capsule, Take 20 mg by mouth daily., Disp: , Rfl:  .  SUMAtriptan (IMITREX) 100 MG tablet, TAKE 1 TABLET - MAY REPEAT IN 2 HOURS IF HEADACHE PERSISTS OR RECURS., Disp: 9 tablet, Rfl: 0 .  albuterol (VENTOLIN HFA) 108 (90 Base) MCG/ACT inhaler, Inhale 1-2 puffs  into the lungs every 4 (four) hours as needed for wheezing or shortness of breath., Disp: 1 each, Rfl: 0 .  cyclobenzaprine (FLEXERIL) 10 MG tablet, Take 1 tablet (10 mg total) by mouth at bedtime., Disp: 20 tablet, Rfl: 0 .  dexamethasone (DECADRON) 4 MG tablet, 4 tablets (16 mg) po at once on day 1 and 4 tabs (16 mg) po at once on day 2, Disp: 8 tablet, Rfl: 0 .  doxycycline (VIBRAMYCIN) 100 MG capsule, Take 1 capsule (100 mg total) by mouth 2 (two) times daily for 10 days., Disp: 20 capsule, Rfl: 0 .  fluticasone (FLONASE) 50 MCG/ACT nasal spray, Place 2 sprays into both  nostrils daily., Disp: 16 g, Rfl: 0 .  ibuprofen (ADVIL) 600 MG tablet, Take 1 tablet (600 mg total) by mouth every 6 (six) hours as needed., Disp: 30 tablet, Rfl: 0 .  Spacer/Aero-Holding Chambers (AEROCHAMBER PLUS) inhaler, Use with inhaler, Disp: 1 each, Rfl: 2  Allergies  Allergen Reactions  . Augmentin [Amoxicillin-Pot Clavulanate]     Vomiting  . Sulfa Antibiotics     Rash     ROS  As noted in HPI.   Physical Exam  BP (!) 128/91 (BP Location: Right Arm)   Pulse 94   Resp 20   Ht 5\' 7"  (1.702 m)   Wt 93 kg   LMP 04/28/2020   SpO2 99%   BMI 32.11 kg/m   Constitutional: Well developed, well nourished, no acute distress Eyes:  EOMI, conjunctiva normal bilaterally HENT: Normocephalic, atraumatic,mucus membranes moist.  External ear, EAC, TMs normal bilaterally.  No Pain with traction on pinna, palpation of mastoid b/l.  Positive tenderness with palpation of tragus bilaterally.  Positive tenderness to the TMJ bilaterally.  No crepitus.  Positive nasal congestion with erythematous, swollen turbinates.  Positive maxillary, frontal sinus tenderness.  Normal oropharynx.  No obvious postnasal drip. Respiratory: Normal inspiratory effort, fair air movement.  Lungs clear.  No chest wall tenderness Cardiovascular: Normal rate, regular rhythm no murmurs rubs or gallops GI: nondistended skin: No rash, skin intact Musculoskeletal: no deformities Neurologic: Alert & oriented x 3, no focal neuro deficits Psychiatric: Speech and behavior appropriate   ED Course   Medications - No data to display  Orders Placed This Encounter  Procedures  . SARS CORONAVIRUS 2 (TAT 6-24 HRS) Nasopharyngeal Nasopharyngeal Swab    Standing Status:   Standing    Number of Occurrences:   1    Order Specific Question:   Is this test for diagnosis or screening    Answer:   Diagnosis of ill patient    Order Specific Question:   Symptomatic for COVID-19 as defined by CDC    Answer:   Yes    Order  Specific Question:   Date of Symptom Onset    Answer:   05/22/2020    Order Specific Question:   Hospitalized for COVID-19    Answer:   No    Order Specific Question:   Admitted to ICU for COVID-19    Answer:   No    Order Specific Question:   Previously tested for COVID-19    Answer:   No    Order Specific Question:   Resident in a congregate (group) care setting    Answer:   No    Order Specific Question:   Employed in healthcare setting    Answer:   No    Order Specific Question:   Pregnant    Answer:   No  Order Specific Question:   Has patient completed COVID vaccination(s) (2 doses of Pfizer/Moderna 1 dose of [employer])    Answer:   Yes  . POC urine preg, ED (not at [facility])    Standing Status:   Standing    Number of Occurrences:   1    Results for orders placed or performed during the hospital encounter of 05/25/20 (from the past 24 hour(s))  POC urine preg, ED (not at [facility])     Status: None   Collection Time: 05/25/20 10:56 AM  Result Value Ref Range   Preg Test, Ur NEGATIVE NEGATIVE   No results found. Results for orders placed or performed during the hospital encounter of 05/25/20  SARS CORONAVIRUS 2 (TAT 6-24 HRS) Nasopharyngeal Nasopharyngeal Swab   Specimen: Nasopharyngeal Swab  Result Value Ref Range   SARS Coronavirus 2 NEGATIVE NEGATIVE  POC urine preg, ED (not at [facility])  Result Value Ref Range   Preg Test, Ur NEGATIVE NEGATIVE   ED Clinical Impression  1. Moderate persistent asthma with exacerbation   2. Bilateral temporomandibular joint pain   3. Acute non-recurrent pansinusitis      ED Assessment/Plan  Urine pregnancy negative.  1.  Asthma exacerbation.  Patient is not really responding to bronchodilators.  She is compliant with her maintenance medications.  Will send home with an another albuterol inhaler with a spacer.  She is to do regularly scheduled albuterol for the next several days.  2 puffs every 4 hours for 2 days, then 2 puffs every 6  hours for 2 days, then as needed.  Will also send home with dexamethasone 16 mg for 2 days.  2.  Otalgia.  Suspect TMJ arthralgia.  She has tenderness over both TMJs, she has no evidence of an otitis media or otitis externa.  home with ibuprofen/Tylenol, Flexeril.  3.  Sinusitis.  Covid PCR sent.  She will be a candidate for monoclonal antibody infusion if positive with a history of asthma and BMI.  She has frontal maxillary sinus tenderness, upper dental pain.  No clear indications for antibiotics, I will send home with a wait-and-see prescription of doxycycline.  She is to use saline nasal irrigation, Flonase, either Mucinex D and if that does not work, an Information systems manager.  COVID negative.  Follow-up with PMD as needed.  To the ER if she gets worse.  Discussed MDM, treatment plan, and plan for follow-up with patient. Discussed sn/sx that should prompt return to the ED. patient agrees with plan.   Meds ordered this encounter  Medications  . fluticasone (FLONASE) 50 MCG/ACT nasal spray    Sig: Place 2 sprays into both nostrils daily.    Dispense:  16 g    Refill:  0  . Spacer/Aero-Holding Chambers (AEROCHAMBER PLUS) inhaler    Sig: Use with inhaler    Dispense:  1 each    Refill:  2    Please educate patient on use  . albuterol (VENTOLIN HFA) 108 (90 Base) MCG/ACT inhaler    Sig: Inhale 1-2 puffs into the lungs every 4 (four) hours as needed for wheezing or shortness of breath.    Dispense:  1 each    Refill:  0  . ibuprofen (ADVIL) 600 MG tablet    Sig: Take 1 tablet (600 mg total) by mouth every 6 (six) hours as needed.    Dispense:  30 tablet    Refill:  0  . dexamethasone (DECADRON) 4 MG tablet  Sig: 4 tablets (16 mg) po at once on day 1 and 4 tabs (16 mg) po at once on day 2    Dispense:  8 tablet    Refill:  0  . doxycycline (VIBRAMYCIN) 100 MG capsule    Sig: Take 1 capsule (100 mg total) by mouth 2 (two) times daily for 10 days.    Dispense:  20  capsule    Refill:  0  . cyclobenzaprine (FLEXERIL) 10 MG tablet    Sig: Take 1 tablet (10 mg total) by mouth at bedtime.    Dispense:  20 tablet    Refill:  0    *This clinic note was created using Scientist, clinical (histocompatibility and immunogenetics). Therefore, there may be occasional mistakes despite careful proofreading.   ?    Domenick Gong, MD 05/26/20 [phone]

## 2020-06-10 DIAGNOSIS — J029 Acute pharyngitis, unspecified: Secondary | ICD-10-CM | POA: Diagnosis not present

## 2020-06-14 DIAGNOSIS — Z7189 Other specified counseling: Secondary | ICD-10-CM | POA: Diagnosis not present

## 2020-06-14 DIAGNOSIS — R070 Pain in throat: Secondary | ICD-10-CM | POA: Diagnosis not present

## 2020-06-22 DIAGNOSIS — J029 Acute pharyngitis, unspecified: Secondary | ICD-10-CM | POA: Diagnosis not present

## 2020-08-18 DIAGNOSIS — Z0189 Encounter for other specified special examinations: Secondary | ICD-10-CM | POA: Diagnosis not present

## 2020-08-23 DIAGNOSIS — J45998 Other asthma: Secondary | ICD-10-CM | POA: Diagnosis not present

## 2020-08-23 DIAGNOSIS — Z043 Encounter for examination and observation following other accident: Secondary | ICD-10-CM | POA: Diagnosis not present

## 2020-08-23 DIAGNOSIS — Z713 Dietary counseling and surveillance: Secondary | ICD-10-CM | POA: Diagnosis not present

## 2020-08-28 ENCOUNTER — Telehealth: Payer: Self-pay | Admitting: Physician Assistant

## 2020-08-28 DIAGNOSIS — J3089 Other allergic rhinitis: Secondary | ICD-10-CM

## 2020-08-28 DIAGNOSIS — J45909 Unspecified asthma, uncomplicated: Secondary | ICD-10-CM

## 2020-08-28 NOTE — Telephone Encounter (Signed)
Requested medication (s) are due for refill today: Yes  Requested medication (s) are on the active medication list: Yes  Last refill:  05/24/20  Future visit scheduled: No  Notes to clinic:  Left pt. Message to call and make appointment.    Requested Prescriptions  Pending Prescriptions Disp Refills   montelukast (SINGULAIR) 10 MG tablet [Pharmacy Med Name: MONTELUKAST SOD 10 MG TABLET] 90 tablet 0    Sig: TAKE 1 TABLET BY MOUTH EVERYDAY AT BEDTIME      Pulmonology:  Leukotriene Inhibitors Failed - 08/28/2020 12:44 AM      Failed - Valid encounter within last 12 months    Recent Outpatient Visits           1 year ago Annual physical exam   [facility] Trey Sailors, [place]   2 years ago Annual physical exam   [facility] Trey Sailors, [place]   3 years ago Annual physical exam   [facility] Trey Sailors, [place]   3 years ago Encounter to establish care   [facility] [place], Lavella Hammock, [place]

## 2020-08-30 MED ORDER — MONTELUKAST SODIUM 10 MG PO TABS: 10.0000 mg | ORAL_TABLET | Freq: Every day | ORAL | 0 refills | Status: DC

## 2020-08-30 NOTE — Telephone Encounter (Signed)
Refill sent.

## 2020-08-30 NOTE — Addendum Note (Signed)
Addended by: Trey Sailors on: 08/30/2020 01:10 PM   Modules accepted: Orders

## 2020-08-30 NOTE — Telephone Encounter (Signed)
Pt just scheduled CPE appt, states that she will run out of this Rx in 4 days. Requesting refill to last her until scheduled appt time 09/08/2020.

## 2020-08-30 NOTE — Telephone Encounter (Signed)
Patient was advised via detailed vm per DPR.

## 2020-09-07 NOTE — Progress Notes (Signed)
Complete physical exam   Patient: Kim Thompson   DOB: 02-28-1981   40 y.o. Female  MRN: [id number] Visit Date: 09/08/2020  Today's healthcare provider: Trey Sailors, PA-C   Chief Complaint  Patient presents with  . Annual Exam  I,Emersynn Deatley M Molina Hollenback,acting as a scribe for Trey Sailors, PA-C.,have documented all relevant documentation on the behalf of Trey Sailors, PA-C,as directed by  Trey Sailors, PA-C while in the presence of Trey Sailors, PA-C.  Subjective    Kim Thompson is a 40 y.o. female who presents today for a complete physical exam.  She reports consuming a general diet. The patient does not participate in regular exercise at present. She generally feels fairly well. She reports sleeping fairly well. She does have additional problems to discuss today.  HPI   She would like to proceed with fertility treatments.   She continues to use advair daily and PRN albuterol.   Got labs at work which she reports were normal.   Looking for a house in [place].   No past medical history on file. Past Surgical History:  Procedure Laterality Date  . ADENOIDECTOMY  1994  . CHOLECYSTECTOMY  2003  . MYRINGOTOMY WITH TUBE PLACEMENT Bilateral    X's three times.   Social History   Socioeconomic History  . Marital status: Single    Spouse name: Not on file  . Number of children: Not on file  . Years of education: Not on file  . Highest education level: Not on file  Occupational History  . Occupation: Full time    Employer: [employer]  Tobacco Use  . Smoking status: Never Smoker  . Smokeless tobacco: Never Used  Vaping Use  . Vaping Use: Never used  Substance and Sexual Activity  . Alcohol use: Yes    Comment: Very Rarely  . Drug use: No  . Sexual activity: Yes    Partners: Male    Birth control/protection: Condom  Other Topics Concern  . Not on file  Social History Narrative  . Not on file   Social Determinants of Health    Financial Resource Strain: Not on file  Food Insecurity: Not on file  Transportation Needs: Not on file  Physical Activity: Not on file  Stress: Not on file  Social Connections: Not on file  Intimate Partner Violence: Not on file   Family Status  Relation Name Status  . Mother  Alive  . Father  Alive  . Brother  Alive  . MGM  (Not Specified)  . MGF  (Not Specified)  . PGM  Deceased  . PGF  (Not Specified)  . Mat Uncle  (Not Specified)  . Mat Uncle  (Not Specified)   Family History  Problem Relation Age of Onset  . Irregular heart beat Mother   . Asthma Mother   . Asthma Father   . Asthma Brother   . Healthy Maternal Grandmother   . Osteoporosis Maternal Grandmother   . Aortic aneurysm Maternal Grandfather   . Diabetes Paternal Grandmother   . Breast cancer Paternal Grandmother   . Heart attack Paternal Grandmother   . Stroke Paternal Grandmother   . Hypertension Paternal Grandfather   . Stroke Paternal Grandfather   . Colon cancer Maternal Uncle   . Diabetes Maternal Uncle    Allergies  Allergen Reactions  . Augmentin [Amoxicillin-Pot Clavulanate]     Vomiting  . Sulfa Antibiotics     Rash  Patient Care Team: Maryella Shivers as PCP - General (Physician Assistant)   Medications: Outpatient Medications Prior to Visit  Medication Sig  . fluticasone (FLONASE) 50 MCG/ACT nasal spray Place 2 sprays into both nostrils daily.  Marland Kitchen ibuprofen (ADVIL) 600 MG tablet Take 1 tablet (600 mg total) by mouth every 6 (six) hours as needed.  Marland Kitchen omeprazole (PRILOSEC) 20 MG capsule Take 20 mg by mouth daily.  Marland Kitchen Spacer/Aero-Holding Chambers (AEROCHAMBER PLUS) inhaler Use with inhaler  . [DISCONTINUED] ADVAIR DISKUS 100-50 MCG/DOSE AEPB TAKE 1 PUFF BY MOUTH TWICE A DAY  . [DISCONTINUED] albuterol (VENTOLIN HFA) 108 (90 Base) MCG/ACT inhaler Inhale 1-2 puffs into the lungs every 4 (four) hours as needed for wheezing or shortness of breath.  . [DISCONTINUED] montelukast  (SINGULAIR) 10 MG tablet Take 1 tablet (10 mg total) by mouth at bedtime.  . [DISCONTINUED] SUMAtriptan (IMITREX) 100 MG tablet TAKE 1 TABLET - MAY REPEAT IN 2 HOURS IF HEADACHE PERSISTS OR RECURS.  . [DISCONTINUED] cyclobenzaprine (FLEXERIL) 10 MG tablet Take 1 tablet (10 mg total) by mouth at bedtime. (Patient not taking: Reported on 09/08/2020)  . [DISCONTINUED] dexamethasone (DECADRON) 4 MG tablet 4 tablets (16 mg) po at once on day 1 and 4 tabs (16 mg) po at once on day 2 (Patient not taking: Reported on 09/08/2020)   No facility-administered medications prior to visit.    Review of Systems  Constitutional: Negative.   HENT: Negative.   Eyes: Negative.   Respiratory: Negative.   Cardiovascular: Negative.   Gastrointestinal: Negative.   Endocrine: Negative.   Genitourinary: Negative.   Musculoskeletal: Negative.   Skin: Negative.   Allergic/Immunologic: Negative.   Neurological: Negative.   Hematological: Negative.   Psychiatric/Behavioral: Negative.       Objective    BP 126/85 (BP Location: Left Arm, Patient Position: Sitting, Cuff Size: Normal)   Pulse 96   Temp 98.4 F (36.9 C) (Oral)   Ht 5\' 7"  (1.702 m)   Wt 222 lb 9.6 oz (101 kg)   SpO2 99%   BMI 34.86 kg/m    Physical Exam Constitutional:      Appearance: Normal appearance.  HENT:     Right Ear: Tympanic membrane, ear canal and external ear normal.     Left Ear: Tympanic membrane, ear canal and external ear normal.  Cardiovascular:     Rate and Rhythm: Normal rate and regular rhythm.     Pulses: Normal pulses.     Heart sounds: Normal heart sounds.  Pulmonary:     Effort: Pulmonary effort is normal. No respiratory distress.     Breath sounds: Normal breath sounds.  Abdominal:     General: Abdomen is flat. Bowel sounds are normal.     Palpations: Abdomen is soft.  Skin:    General: Skin is warm and dry.  Neurological:     General: No focal deficit present.     Mental Status: She is alert and oriented  to person, place, and time.  Psychiatric:        Mood and Affect: Mood normal.        Behavior: Behavior normal.       Last depression screening scores PHQ 2/9 Scores 09/08/2020 06/26/2019 05/21/2018  PHQ - 2 Score 0 0 0  PHQ- 9 Score 3 - 1   Last fall risk screening Fall Risk  09/08/2020  Falls in the past year? 0  Number falls in past yr: 0  Injury with Fall? 0  Risk  for fall due to : No Fall Risks  Follow up Falls evaluation completed   Last Audit-C alcohol use screening Alcohol Use Disorder Test (AUDIT) 09/08/2020  1. How often do you have a drink containing alcohol? 2  2. How many drinks containing alcohol do you have on a typical day when you are drinking? 0  3. How often do you have six or more drinks on one occasion? 0  AUDIT-C Score 2   A score of 3 or more in women, and 4 or more in men indicates increased risk for alcohol abuse, EXCEPT if all of the points are from question 1   No results found for any visits on 09/08/20.  Assessment & Plan    Routine Health Maintenance and Physical Exam  Exercise Activities and Dietary recommendations Goals   None     Immunization History  Administered Date(s) Administered  . Influenza,inj,Quad PF,6+ Mos 04/01/2017  . Influenza-Unspecified 04/04/2018    Health Maintenance  Topic Date Due  . Hepatitis C Screening  Never done  . COVID-19 Vaccine (1) Never done  . HIV Screening  Never done  . INFLUENZA VACCINE  10/06/2020 (Originally 02/07/2020)  . PAP SMEAR-Modifier  04/01/2022  . TETANUS/TDAP  02/06/2025  . HPV VACCINES  Aged Out    Discussed health benefits of physical activity, and encouraged her to engage in regular exercise appropriate for her age and condition.  1. Annual physical exam   2. Asthma, unspecified asthma severity, unspecified whether complicated, unspecified whether persistent  - ADVAIR DISKUS 100-50 MCG/DOSE AEPB; TAKE 1 PUFF BY MOUTH TWICE A DAY  Dispense: 180 each; Refill: 3 - albuterol  (VENTOLIN HFA) 108 (90 Base) MCG/ACT inhaler; Inhale 1-2 puffs into the lungs every 4 (four) hours as needed for wheezing or shortness of breath.  Dispense: 1 each; Refill: 3 - montelukast (SINGULAIR) 10 MG tablet; Take 1 tablet (10 mg total) by mouth at bedtime.  Dispense: 90 tablet; Refill: 3 - Ambulatory referral to Pulmonology  3. Seasonal allergic rhinitis due to other allergic trigger  - montelukast (SINGULAIR) 10 MG tablet; Take 1 tablet (10 mg total) by mouth at bedtime.  Dispense: 90 tablet; Refill: 3  4. Migraine with aura and without status migrainosus, not intractable  - SUMAtriptan (IMITREX) 100 MG tablet; May repeat in 2 hours if headache persists or recurs.  Dispense: 9 tablet; Refill: 0  5. Encounter for fertility testing  - Ambulatory referral to Obstetrics / Gynecology   No follow-ups on file.     ITrey Sailors, PA-C, have reviewed all documentation for this visit. The documentation on 09/08/20 for the exam, diagnosis, procedures, and orders are all accurate and complete.  The entirety of the information documented in the History of Present Illness, Review of Systems and Physical Exam were personally obtained by me. Portions of this information were initially documented by [facility] and reviewed by me for thoroughness and accuracy.     Maryella Shivers  [facility] [phone] (phone) [phone] (fax)  [facility]

## 2020-09-08 ENCOUNTER — Ambulatory Visit: Payer: BC Managed Care – PPO | Admitting: Physician Assistant

## 2020-09-08 ENCOUNTER — Encounter: Payer: Self-pay | Admitting: Physician Assistant

## 2020-09-08 ENCOUNTER — Other Ambulatory Visit: Payer: Self-pay | Admitting: Physician Assistant

## 2020-09-08 ENCOUNTER — Other Ambulatory Visit: Payer: Self-pay

## 2020-09-08 VITALS — BP 126/85 | HR 96 | Temp 98.4°F | Ht 67.0 in | Wt 222.6 lb

## 2020-09-08 DIAGNOSIS — Z3141 Encounter for fertility testing: Secondary | ICD-10-CM | POA: Diagnosis not present

## 2020-09-08 DIAGNOSIS — Z0000 Encounter for general adult medical examination without abnormal findings: Secondary | ICD-10-CM

## 2020-09-08 DIAGNOSIS — J45909 Unspecified asthma, uncomplicated: Secondary | ICD-10-CM

## 2020-09-08 DIAGNOSIS — G43109 Migraine with aura, not intractable, without status migrainosus: Secondary | ICD-10-CM

## 2020-09-08 DIAGNOSIS — J3089 Other allergic rhinitis: Secondary | ICD-10-CM

## 2020-09-08 MED ORDER — ADVAIR DISKUS 100-50 MCG/DOSE IN AEPB: INHALATION_SPRAY | RESPIRATORY_TRACT | 3 refills | Status: AC

## 2020-09-08 MED ORDER — MONTELUKAST SODIUM 10 MG PO TABS: 10.0000 mg | ORAL_TABLET | Freq: Every day | ORAL | 3 refills | Status: DC

## 2020-09-08 MED ORDER — ALBUTEROL SULFATE HFA 108 (90 BASE) MCG/ACT IN AERS: 1.0000 | INHALATION_SPRAY | RESPIRATORY_TRACT | 3 refills | Status: DC | PRN

## 2020-09-08 MED ORDER — SUMATRIPTAN SUCCINATE 100 MG PO TABS: ORAL_TABLET | ORAL | 0 refills | Status: DC

## 2020-09-08 NOTE — Telephone Encounter (Signed)
Requested medication (s) are due for refill today:   Prescribed today by Osvaldo Angst  Requested medication (s) are on the active medication list:   Yes  Future visit scheduled:   Seen today   Last ordered: Today 09/08/2020  Clinic note:  Pharmacy requesting clarification of the dosage.     Requested Prescriptions  Pending Prescriptions Disp Refills   SUMAtriptan (IMITREX) 100 MG tablet [Pharmacy Med Name: SUMATRIPTAN SUCC 100 MG TABLET] 9 tablet 0    Sig: May repeat in 2 hours if headache persists or recurs.      Neurology:  Migraine Therapy - Triptan Passed - 09/08/2020 11:54 AM      Passed - Last BP in normal range    BP Readings from Last 1 Encounters:  09/08/20 126/85          Passed - Valid encounter within last 12 months    Recent Outpatient Visits           Today Annual physical exam   [facility] Trey Sailors, [place]   1 year ago Annual physical exam   [facility] Trey Sailors, [place]   2 years ago Annual physical exam   [facility] Trey Sailors, [place]   3 years ago Annual physical exam   [facility] Trey Sailors, [place]   3 years ago Encounter to establish care   [facility] [place], Lavella Hammock, [place]

## 2020-09-08 NOTE — Patient Instructions (Signed)
Health Maintenance, Female Adopting a healthy lifestyle and getting preventive care are important in promoting health and wellness. Ask your health care provider about:  The right schedule for you to have regular tests and exams.  Things you can do on your own to prevent diseases and keep yourself healthy. What should I know about diet, weight, and exercise? Eat a healthy diet  Eat a diet that includes plenty of vegetables, fruits, low-fat dairy products, and lean protein.  Do not eat a lot of foods that are high in solid fats, added sugars, or sodium.   Maintain a healthy weight Body mass index (BMI) is used to identify weight problems. It estimates body fat based on height and weight. Your health care provider can help determine your BMI and help you achieve or maintain a healthy weight. Get regular exercise Get regular exercise. This is one of the most important things you can do for your health. Most adults should:  Exercise for at least 150 minutes each week. The exercise should increase your heart rate and make you sweat (moderate-intensity exercise).  Do strengthening exercises at least twice a week. This is in addition to the moderate-intensity exercise.  Spend less time sitting. Even light physical activity can be beneficial. Watch cholesterol and blood lipids Have your blood tested for lipids and cholesterol at 38 years of age, then have this test every 5 years. Have your cholesterol levels checked more often if:  Your lipid or cholesterol levels are high.  You are older than 38 years of age.  You are at high risk for heart disease. What should I know about cancer screening? Depending on your health history and family history, you may need to have cancer screening at various ages. This may include screening for:  Breast cancer.  Cervical cancer.  Colorectal cancer.  Skin cancer.  Lung cancer. What should I know about heart disease, diabetes, and high blood  pressure? Blood pressure and heart disease  High blood pressure causes heart disease and increases the risk of stroke. This is more likely to develop in people who have high blood pressure readings, are of African descent, or are overweight.  Have your blood pressure checked: ? Every 3-5 years if you are 18-39 years of age. ? Every year if you are 40 years old or older. Diabetes Have regular diabetes screenings. This checks your fasting blood sugar level. Have the screening done:  Once every three years after age 40 if you are at a normal weight and have a low risk for diabetes.  More often and at a younger age if you are overweight or have a high risk for diabetes. What should I know about preventing infection? Hepatitis B If you have a higher risk for hepatitis B, you should be screened for this virus. Talk with your health care provider to find out if you are at risk for hepatitis B infection. Hepatitis C Testing is recommended for:  Everyone born from 1945 through 1965.  Anyone with known risk factors for hepatitis C. Sexually transmitted infections (STIs)  Get screened for STIs, including gonorrhea and chlamydia, if: ? You are sexually active and are younger than 38 years of age. ? You are older than 38 years of age and your health care provider tells you that you are at risk for this type of infection. ? Your sexual activity has changed since you were last screened, and you are at increased risk for chlamydia or gonorrhea. Ask your health care provider   if you are at risk.  Ask your health care provider about whether you are at high risk for HIV. Your health care provider may recommend a prescription medicine to help prevent HIV infection. If you choose to take medicine to prevent HIV, you should first get tested for HIV. You should then be tested every 3 months for as long as you are taking the medicine. Pregnancy  If you are about to stop having your period (premenopausal) and  you may become pregnant, seek counseling before you get pregnant.  Take 400 to 800 micrograms (mcg) of folic acid every day if you become pregnant.  Ask for birth control (contraception) if you want to prevent pregnancy. Osteoporosis and menopause Osteoporosis is a disease in which the bones lose minerals and strength with aging. This can result in bone fractures. If you are 65 years old or older, or if you are at risk for osteoporosis and fractures, ask your health care provider if you should:  Be screened for bone loss.  Take a calcium or vitamin D supplement to lower your risk of fractures.  Be given hormone replacement therapy (HRT) to treat symptoms of menopause. Follow these instructions at home: Lifestyle  Do not use any products that contain nicotine or tobacco, such as cigarettes, e-cigarettes, and chewing tobacco. If you need help quitting, ask your health care provider.  Do not use street drugs.  Do not share needles.  Ask your health care provider for help if you need support or information about quitting drugs. Alcohol use  Do not drink alcohol if: ? Your health care provider tells you not to drink. ? You are pregnant, may be pregnant, or are planning to become pregnant.  If you drink alcohol: ? Limit how much you use to 0-1 drink a day. ? Limit intake if you are breastfeeding.  Be aware of how much alcohol is in your drink. In the [place] one drink equals one 12 oz bottle of beer (355 mL), one 5 oz glass of wine (148 mL), or one 1 oz glass of hard liquor (44 mL). General instructions  Schedule regular health, dental, and eye exams.  Stay current with your vaccines.  Tell your health care provider if: ? You often feel depressed. ? You have ever been abused or do not feel safe at home. Summary  Adopting a healthy lifestyle and getting preventive care are important in promoting health and wellness.  Follow your health care provider's instructions about healthy  diet, exercising, and getting tested or screened for diseases.  Follow your health care provider's instructions on monitoring your cholesterol and blood pressure. This information is not intended to replace advice given to you by your health care provider. Make sure you discuss any questions you have with your health care provider. Document Revised: 06/18/2018 Document Reviewed: 06/18/2018 Elsevier Patient Education  2021 Elsevier Inc.  

## 2020-09-19 DIAGNOSIS — Z1152 Encounter for screening for COVID-19: Secondary | ICD-10-CM | POA: Diagnosis not present

## 2020-10-11 ENCOUNTER — Other Ambulatory Visit: Payer: Self-pay | Admitting: Physician Assistant

## 2020-10-11 DIAGNOSIS — G43109 Migraine with aura, not intractable, without status migrainosus: Secondary | ICD-10-CM

## 2020-11-03 DIAGNOSIS — J019 Acute sinusitis, unspecified: Secondary | ICD-10-CM | POA: Diagnosis not present

## 2021-01-31 DIAGNOSIS — Z3141 Encounter for fertility testing: Secondary | ICD-10-CM | POA: Diagnosis not present

## 2021-01-31 DIAGNOSIS — N978 Female infertility of other origin: Secondary | ICD-10-CM | POA: Diagnosis not present

## 2021-03-08 ENCOUNTER — Other Ambulatory Visit: Payer: Self-pay | Admitting: Family Medicine

## 2021-03-08 ENCOUNTER — Telehealth: Payer: Self-pay | Admitting: Physician Assistant

## 2021-03-08 DIAGNOSIS — J45909 Unspecified asthma, uncomplicated: Secondary | ICD-10-CM

## 2021-03-08 MED ORDER — ALBUTEROL SULFATE HFA 108 (90 BASE) MCG/ACT IN AERS: 1.0000 | INHALATION_SPRAY | RESPIRATORY_TRACT | 1 refills | Status: DC | PRN

## 2021-03-08 NOTE — Telephone Encounter (Signed)
LOV: 09/08/20 Last refill: 09/08/20

## 2021-03-08 NOTE — Telephone Encounter (Signed)
[facility] faxed refill request for the following medications:   albuterol (VENTOLIN HFA) 108 (90 Base) MCG/ACT inhaler    Please advise.  

## 2021-03-08 NOTE — Telephone Encounter (Signed)
Need to schedule follow up with new provider. Will send a couple inhalers to her pharmacy to carry her until appointment arranged.

## 2021-03-27 DIAGNOSIS — J45998 Other asthma: Secondary | ICD-10-CM | POA: Diagnosis not present

## 2021-04-17 DIAGNOSIS — Z23 Encounter for immunization: Secondary | ICD-10-CM | POA: Diagnosis not present

## 2021-06-25 IMAGING — DX DG CHEST 2V
2 series · 2 of 2 positions shown · non-contrast
Comparison: 06/24/2017 chest radiograph.

CLINICAL DATA: Cough, dyspnea

EXAM:
CHEST - 2 VIEW

[chest pa]
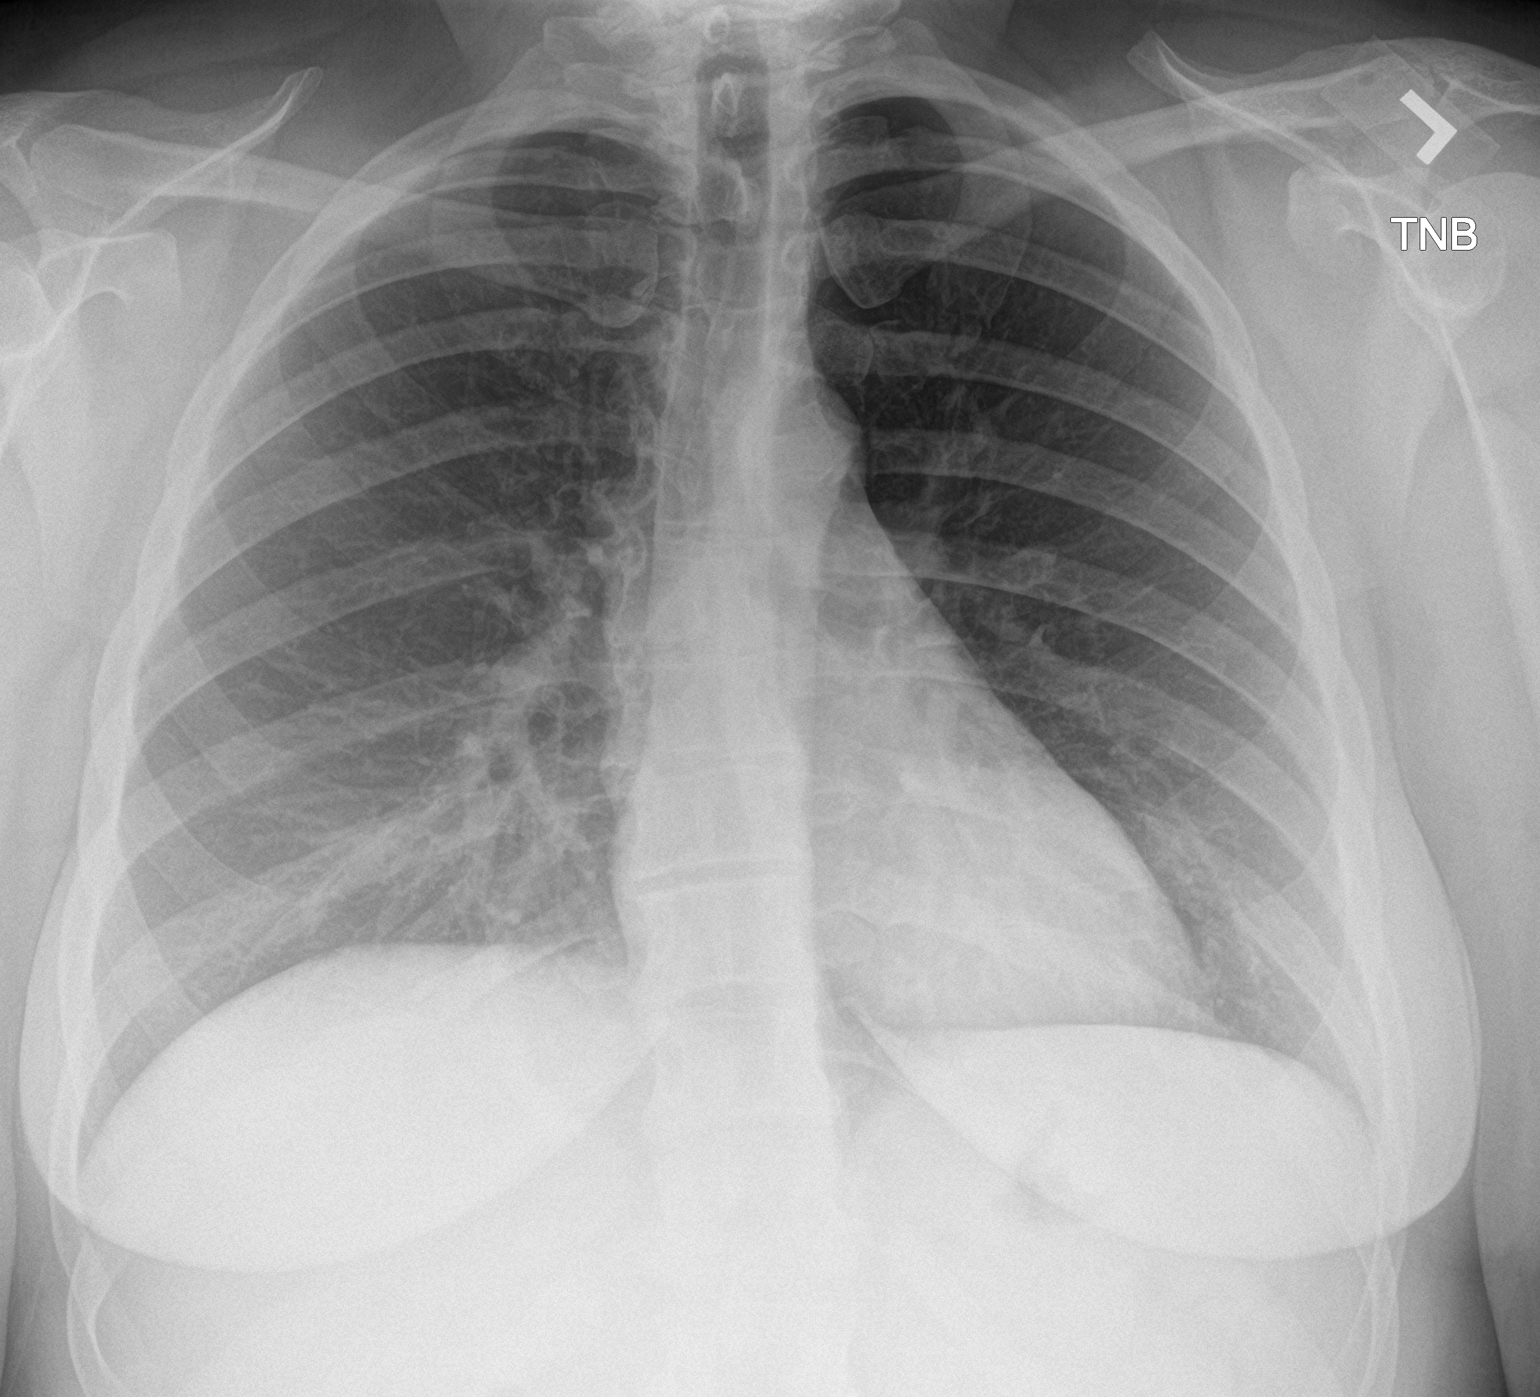

[chest lat]
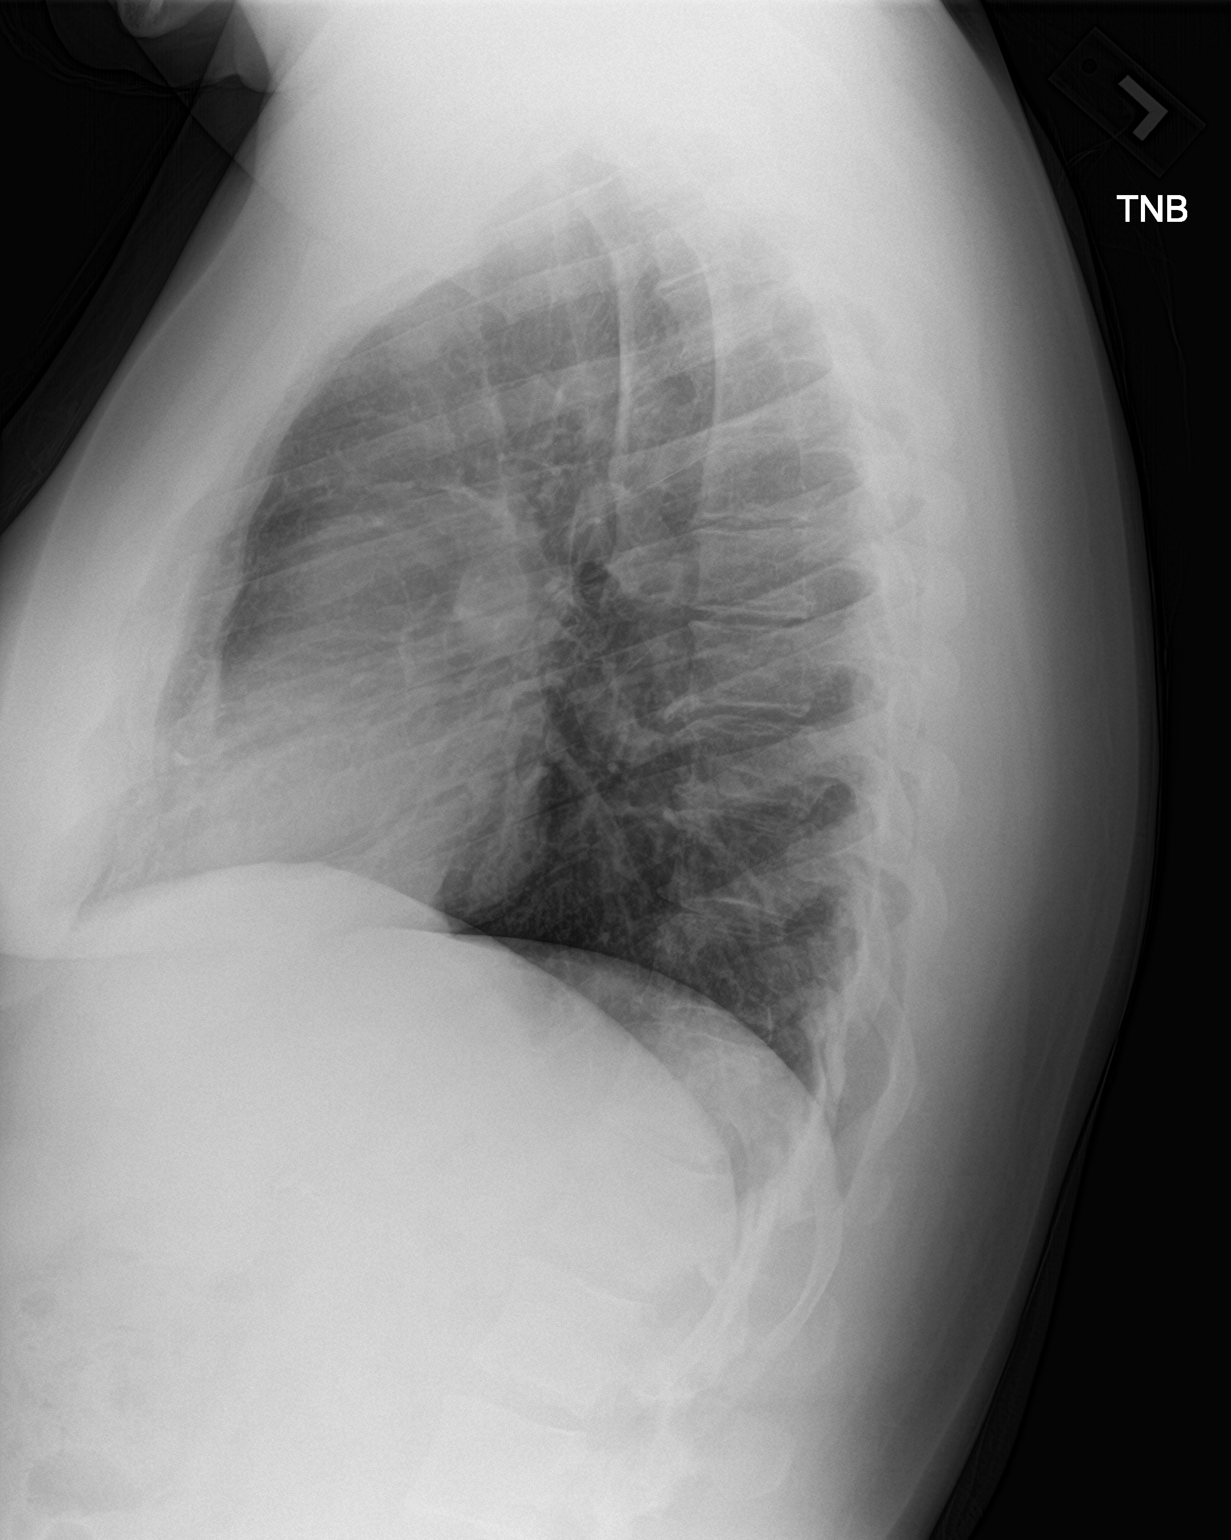

[2 of 2 positions shown; findings below may reference images not displayed]

FINDINGS: Stable cardiomediastinal silhouette with normal heart size. No
pneumothorax. No pleural effusion. Faint hazy right middle lobe
opacity.
IMPRESSION: Faint hazy right middle lobe opacity, cannot exclude early
pneumonia.

## 2021-06-26 DIAGNOSIS — M79644 Pain in right finger(s): Secondary | ICD-10-CM | POA: Diagnosis not present

## 2021-06-26 DIAGNOSIS — G90511 Complex regional pain syndrome I of right upper limb: Secondary | ICD-10-CM | POA: Diagnosis not present

## 2021-06-26 DIAGNOSIS — M79641 Pain in right hand: Secondary | ICD-10-CM | POA: Diagnosis not present

## 2021-07-11 DIAGNOSIS — M79644 Pain in right finger(s): Secondary | ICD-10-CM | POA: Diagnosis not present

## 2021-07-31 DIAGNOSIS — H9209 Otalgia, unspecified ear: Secondary | ICD-10-CM | POA: Diagnosis not present

## 2021-08-23 DIAGNOSIS — Z0189 Encounter for other specified special examinations: Secondary | ICD-10-CM | POA: Diagnosis not present

## 2021-08-23 LAB — IRON,TIBC AND FERRITIN PANEL: Ferritin: 7

## 2021-08-24 DIAGNOSIS — R748 Abnormal levels of other serum enzymes: Secondary | ICD-10-CM | POA: Diagnosis not present

## 2021-08-24 DIAGNOSIS — Z043 Encounter for examination and observation following other accident: Secondary | ICD-10-CM | POA: Diagnosis not present

## 2021-08-24 DIAGNOSIS — Z713 Dietary counseling and surveillance: Secondary | ICD-10-CM | POA: Diagnosis not present

## 2021-09-04 ENCOUNTER — Telehealth: Payer: Self-pay | Admitting: Physician Assistant

## 2021-09-04 ENCOUNTER — Other Ambulatory Visit: Payer: Self-pay

## 2021-09-04 DIAGNOSIS — J45909 Unspecified asthma, uncomplicated: Secondary | ICD-10-CM

## 2021-09-04 NOTE — Telephone Encounter (Signed)
[facility] ref[facility] for the following medications:  ADVAIR DISKUS 100-50 MCG/DOSE AEPB   Please advise

## 2021-09-05 MED ORDER — FLUTICASONE-SALMETEROL 100-50 MCG/ACT IN AEPB: 1.0000 | INHALATION_SPRAY | Freq: Two times a day (BID) | RESPIRATORY_TRACT | 1 refills | Status: AC

## 2021-09-11 DIAGNOSIS — J45998 Other asthma: Secondary | ICD-10-CM | POA: Diagnosis not present

## 2021-09-11 DIAGNOSIS — Z0189 Encounter for other specified special examinations: Secondary | ICD-10-CM | POA: Diagnosis not present

## 2021-09-11 LAB — CBC AND DIFFERENTIAL
HCT: 33 — AB (ref 36–46)
Hemoglobin: 11.1 — AB (ref 12.0–16.0)
Platelets: 338 10*3/uL (ref 150–400)

## 2021-09-11 LAB — CBC: RBC: 4 (ref 3.87–5.11)

## 2021-09-12 DIAGNOSIS — Z7189 Other specified counseling: Secondary | ICD-10-CM | POA: Diagnosis not present

## 2021-11-30 ENCOUNTER — Other Ambulatory Visit: Payer: Self-pay | Admitting: Physician Assistant

## 2021-11-30 ENCOUNTER — Telehealth: Payer: Self-pay | Admitting: Physician Assistant

## 2021-11-30 DIAGNOSIS — J3089 Other allergic rhinitis: Secondary | ICD-10-CM

## 2021-11-30 DIAGNOSIS — J45909 Unspecified asthma, uncomplicated: Secondary | ICD-10-CM

## 2021-11-30 MED ORDER — MONTELUKAST SODIUM 10 MG PO TABS: 10.0000 mg | ORAL_TABLET | Freq: Every day | ORAL | 3 refills | Status: DC

## 2021-11-30 NOTE — Telephone Encounter (Signed)
[facility] ref[facility] for the following medications:  montelukast (SINGULAIR) 10 MG tablet   Please advise

## 2021-11-30 NOTE — Progress Notes (Addendum)
Needs to schedule an office appt. Last was seen by Andriana in 3.2022 Could you schedule an appt with her for annual visit(she is due)? Meanwhile, I will refill her Singulair. Thank you

## 2021-12-01 ENCOUNTER — Telehealth: Payer: Self-pay | Admitting: Physician Assistant

## 2021-12-11 NOTE — Progress Notes (Signed)
I,Roshena L Chambers,acting as a Neurosurgeon for [employer], PA-C.,have documented all relevant documentation on the behalf of [name], PA-C,as directed by  [employer], PA-C while in the presence of [employer], PA-C.  Complete physical exam   Patient: Kim Thompson   DOB: [birth year]   41 y.o. Female  MRN: [id number] Visit Date: 12/12/2021  Today's healthcare provider: [name], PA-C   Chief Complaint  Patient presents with   Annual Exam   Subjective    Kim Thompson is a 41 y.o. female who presents today for a complete physical exam.  She reports consuming a general diet. Home exercise routine includes light weight lifting. She generally feels fairly well. She reports sleeping poorly. She does have additional problems to discuss today.   HPI   Has seasonal allergies with runny nose and itchy eyes.  Takes allegra and Singulair.  Has eczema in her hands  Has asthma. Moved to [place] 5 years ago and would like to see a specialist for asthma evaluation. Uses advair, ventolin as needed/ 1-2x week   In fall of 2022, she received a shot of Prednisone for exacerbation of her asthma. 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  States that she started to working out and eating better since she gained some pounds.  No past medical history on file. Past Surgical History:  Procedure Laterality Date   ADENOIDECTOMY  1994   CHOLECYSTECTOMY  2003   MYRINGOTOMY WITH TUBE PLACEMENT Bilateral    X's three times.   Social History    Socioeconomic History   Marital status: Single    Spouse name: Not on file   Number of children: Not on file   Years of education: Not on file   Highest education level: Not on file  Occupational History   Occupation: Full time    Employer: [employer]  Tobacco Use   Smoking status: Never   Smokeless tobacco: Never  Vaping Use   Vaping Use: Never used  Substance and Sexual Activity   Alcohol use: Yes    Comment: Very Rarely   Drug use: No   Sexual activity: Yes    Partners: Male    Birth control/protection: Condom  Other Topics Concern   Not on file  Social History Narrative   Not on file   Social Determinants of Health   Financial Resource Strain: Not on file  Food Insecurity: Not on file  Transportation Needs: Not on file  Physical Activity: Not on file  Stress: Not on file  Social Connections: Not on file  Intimate Partner Violence: Not on file   Family Status  Relation Name Status   Mother  Alive   Father  Alive   Brother  Alive   MGM  (Not Specified)   MGF  (Not Specified)   PGM  Deceased   PGF  (Not Specified)   Mat Uncle  (Not Specified)   Mat Uncle  (  Not Specified)   Family History  Problem Relation Age of Onset   Irregular heart beat Mother    Asthma Mother    Asthma Father    Asthma Brother    Healthy Maternal Grandmother    Osteoporosis Maternal Grandmother    Aortic aneurysm Maternal Grandfather    Diabetes Paternal Grandmother    Breast cancer Paternal Grandmother    Heart attack Paternal Grandmother    Stroke Paternal Grandmother    Hypertension Paternal Grandfather    Stroke Paternal Grandfather    Colon cancer Maternal Uncle    Diabetes Maternal Uncle    Allergies  Allergen Reactions   Augmentin [Amoxicillin-Pot Clavulanate]     Vomiting   Sulfa Antibiotics     Rash    Patient Care Team: Jodi Marble, Adriana M, PA-C (Inactive) as PCP - General (Physician Assistant)   Medications: Outpatient Medications Prior to Visit   Medication Sig   ADVAIR DISKUS 100-50 MCG/DOSE AEPB TAKE 1 PUFF BY MOUTH TWICE A DAY   albuterol (VENTOLIN HFA) 108 (90 Base) MCG/ACT inhaler Inhale 1-2 puffs into the lungs every 4 (four) hours as needed for wheezing or shortness of breath.   fluticasone-salmeterol (ADVAIR) 100-50 MCG/ACT AEPB Inhale 1 puff into the lungs 2 (two) times daily.   montelukast (SINGULAIR) 10 MG tablet Take 1 tablet (10 mg total) by mouth at bedtime.   omeprazole (PRILOSEC) 20 MG capsule Take 20 mg by mouth daily.   Spacer/Aero-Holding Chambers (AEROCHAMBER PLUS) inhaler Use with inhaler   SUMAtriptan (IMITREX) 100 MG tablet TAKE 1 TABLET BY MOUTH (MAY REPEAT IN 2 HOURS IF HEADACHE PERSISTS OR RECURS)   fluticasone (FLONASE) 50 MCG/ACT nasal spray Place 2 sprays into both nostrils daily. (Patient not taking: Reported on 12/12/2021)   [DISCONTINUED] ibuprofen (ADVIL) 600 MG tablet Take 1 tablet (600 mg total) by mouth every 6 (six) hours as needed. (Patient not taking: Reported on 12/12/2021)   No facility-administered medications prior to visit.    Review of Systems  Constitutional:  Negative for chills, fatigue and fever.  HENT:  Positive for congestion, postnasal drip, rhinorrhea, sinus pressure and sneezing. Negative for ear pain and sore throat.   Eyes: Negative.  Negative for pain and redness.  Respiratory:  Negative for cough, shortness of breath and wheezing.   Cardiovascular:  Negative for chest pain and leg swelling.  Gastrointestinal:  Negative for abdominal pain, blood in stool, constipation, diarrhea and nausea.  Endocrine: Negative for polydipsia and polyphagia.  Genitourinary: Negative.  Negative for dysuria, flank pain, hematuria, pelvic pain, vaginal bleeding and vaginal discharge.  Musculoskeletal:  Negative for arthralgias, back pain, gait problem and joint swelling.  Skin:  Positive for rash.  Allergic/Immunologic: Positive for environmental allergies and food allergies.  Neurological:  Negative.  Negative for dizziness, tremors, seizures, weakness, light-headedness, numbness and headaches.  Hematological:  Negative for adenopathy.  Psychiatric/Behavioral: Negative.  Negative for behavioral problems, confusion and dysphoric mood. The patient is not nervous/anxious and is not hyperactive.      Objective     BP 118/89 (BP Location: Right Arm, Patient Position: Sitting, Cuff Size: Large)   Pulse 88   Temp 98.3 F (36.8 C) (Oral)   Resp 16   Ht  (1.702 m)   Wt 224 lb (101.6 kg)   LMP  (Within Weeks)   SpO2 98% Comment: room air  BMI 35.08 kg/m     Physical Exam Vitals reviewed.  Constitutional:      General: She is not  in acute distress.    Appearance: Normal appearance. She is well-developed. She is obese. She is not diaphoretic.  HENT:     Head: Normocephalic and atraumatic.     Right Ear: Tympanic membrane, ear canal and external ear normal.     Left Ear: Tympanic membrane, ear canal and external ear normal.     Nose: Congestion (mild) and rhinorrhea present.     Mouth/Throat:     Mouth: Mucous membranes are moist.     Pharynx: Oropharynx is clear. No oropharyngeal exudate.  Eyes:     General: No scleral icterus.    Conjunctiva/sclera: Conjunctivae normal.     Pupils: Pupils are equal, round, and reactive to light.  Neck:     Thyroid: No thyromegaly.  Cardiovascular:     Rate and Rhythm: Normal rate and regular rhythm.     Pulses: Normal pulses.     Heart sounds: Normal heart sounds. No murmur heard. Pulmonary:     Effort: Pulmonary effort is normal. No respiratory distress.     Breath sounds: Normal breath sounds. No stridor. No wheezing, rhonchi or rales.  Chest:     Chest wall: No tenderness.  Abdominal:     General: There is no distension.     Palpations: Abdomen is soft.     Tenderness: There is no abdominal tenderness.  Musculoskeletal:        General: No deformity.     Cervical back: Normal range of motion and neck supple.      Right lower leg: No edema.     Left lower leg: No edema.  Lymphadenopathy:     Cervical: No cervical adenopathy.  Skin:    General: Skin is warm and dry.     Findings: Rash present.     Comments: Patches of Maculopapular erythema with some oozing on the hands  Neurological:     General: No focal deficit present.     Mental Status: She is alert and oriented to person, place, and time. Mental status is at baseline.     Sensory: No sensory deficit.     Motor: No weakness.     Gait: Gait normal.  Psychiatric:        Mood and Affect: Mood normal.        Behavior: Behavior normal.        Thought Content: Thought content normal.       Last depression screening scores    12/12/2021   10:05 AM 09/08/2020   10:41 AM 06/26/2019   10:40 AM  PHQ 2/9 Scores  PHQ - 2 Score 0 0 0  PHQ- 9 Score 4 3    Last fall risk screening    09/08/2020   10:41 AM  Fall Risk   Falls in the past year? 0  Number falls in past yr: 0  Injury with Fall? 0  Risk for fall due to : No Fall Risks  Follow up Falls evaluation completed   Last Audit-C alcohol use screening    09/08/2020   10:41 AM  Alcohol Use Disorder Test (AUDIT)  1. How often do you have a drink containing alcohol? 2  2. How many drinks containing alcohol do you have on a typical day when you are drinking? 0  3. How often do you have six or more drinks on one occasion? 0  AUDIT-C Score 2   A score of 3 or more in women, and 4 or more in men indicates increased risk for alcohol  abuse, EXCEPT if all of the points are from question 1   No results found for any visits on 12/12/21.  Assessment & Plan    Routine Health Maintenance and Physical Exam  Exercise Activities and Dietary recommendations  Goals   Weight loss of 5% via diet and daily exercise     Immunization History  Administered Date(s) Administered   Influenza,inj,Quad PF,6+ Mos 04/01/2017   Influenza-Unspecified 04/04/2018, 03/27/2019    Health Maintenance  Topic Date  Due   COVID-19 Vaccine (1) Never done   HIV Screening  Never done   Hepatitis C Screening  Never done   INFLUENZA VACCINE  02/06/2022   PAP SMEAR-Modifier  04/01/2022   TETANUS/TDAP  02/06/2025   HPV VACCINES  Aged Out    Discussed health benefits of physical activity, and encouraged her to engage in regular exercise appropriate for her age and condition.  1. Migraine with aura and without status migrainosus, not intractable Continue imitrex  2. Asthma, unspecified asthma severity, unspecified whether complicated, unspecified whether persistent Continue advair and ventolin - Ambulatory referral to Allergy  3. Heartburn Continue omeprazole as needed  Elevate the head of the bed 6-8 inches, avoid recumbency for 3 hours after eating, avoid food as a delayed gastric emptying, weight loss    4. Eczema, unspecified type Advised to avoid irritant and use topical OTC steroids with Eucerin cream BID to the affected areas. - Ambulatory referral to Dermatology  5 Encounter for hepatitis C screening test for low risk patient  - Hepatitis C Antibody  6. Screening for HIV (human immunodeficiency virus)  - HIV antibody (with reflex)  7 Pap smear for cervical cancer screening  - Ambulatory referral to Obstetrics / Gynecology  8. Annual physical exam  9. Obesity BMI 35 Weight loss of 5% w/n 3-6 mo period via diet and daily exercise.  FU as scheduled    The patient was advised to call back or seek an in-person evaluation if the symptoms worsen or if the condition fails to improve as anticipated.  I discussed the assessment and treatment plan with the patient. The patient was provided an opportunity to ask questions and all were answered. The patient agreed with the plan and demonstrated an understanding of the instructions.  The entirety of the information documented in the History of Present Illness, Review of Systems and Physical Exam were personally obtained by me. Portions of  this information were initially documented by the CMA and reviewed by me for thoroughness and accuracy.  Portions of this note were created using dictation software and may contain typographical errors.    Debera LatJanna Daliah Chaudoin, PA-C  [facility] [phone] (phone) [phone] (fax)  [facility]

## 2021-12-12 ENCOUNTER — Telehealth: Payer: Self-pay

## 2021-12-12 ENCOUNTER — Ambulatory Visit (INDEPENDENT_AMBULATORY_CARE_PROVIDER_SITE_OTHER): Payer: BC Managed Care – PPO | Admitting: Physician Assistant

## 2021-12-12 ENCOUNTER — Encounter: Payer: Self-pay | Admitting: Physician Assistant

## 2021-12-12 VITALS — BP 118/89 | HR 88 | Temp 98.3°F | Resp 16 | Ht 67.0 in | Wt 224.0 lb

## 2021-12-12 DIAGNOSIS — G43109 Migraine with aura, not intractable, without status migrainosus: Secondary | ICD-10-CM | POA: Diagnosis not present

## 2021-12-12 DIAGNOSIS — R12 Heartburn: Secondary | ICD-10-CM | POA: Diagnosis not present

## 2021-12-12 DIAGNOSIS — E669 Obesity, unspecified: Secondary | ICD-10-CM

## 2021-12-12 DIAGNOSIS — J45909 Unspecified asthma, uncomplicated: Secondary | ICD-10-CM | POA: Diagnosis not present

## 2021-12-12 DIAGNOSIS — Z0000 Encounter for general adult medical examination without abnormal findings: Secondary | ICD-10-CM

## 2021-12-12 DIAGNOSIS — Z114 Encounter for screening for human immunodeficiency virus [HIV]: Secondary | ICD-10-CM | POA: Diagnosis not present

## 2021-12-12 DIAGNOSIS — Z124 Encounter for screening for malignant neoplasm of cervix: Secondary | ICD-10-CM

## 2021-12-12 DIAGNOSIS — Z1159 Encounter for screening for other viral diseases: Secondary | ICD-10-CM | POA: Diagnosis not present

## 2021-12-12 DIAGNOSIS — L309 Dermatitis, unspecified: Secondary | ICD-10-CM | POA: Diagnosis not present

## 2021-12-12 DIAGNOSIS — Z800 Family history of malignant neoplasm of digestive organs: Secondary | ICD-10-CM

## 2021-12-12 NOTE — Telephone Encounter (Signed)
[facility] referring for annual women exam with pap smear. Sch any provider. Called and left voicemail for patient to call back to be scheduled.

## 2021-12-13 ENCOUNTER — Other Ambulatory Visit: Payer: Self-pay | Admitting: Physician Assistant

## 2021-12-13 DIAGNOSIS — J45909 Unspecified asthma, uncomplicated: Secondary | ICD-10-CM

## 2021-12-13 LAB — HIV ANTIBODY (ROUTINE TESTING W REFLEX): HIV Screen 4th Generation wRfx: NONREACTIVE

## 2021-12-13 LAB — HEPATITIS C ANTIBODY: Hep C Virus Ab: NONREACTIVE

## 2021-12-13 NOTE — Telephone Encounter (Signed)
Patient is scheduled for 12/14/21 with PC

## 2021-12-14 ENCOUNTER — Other Ambulatory Visit (HOSPITAL_COMMUNITY)
Admission: RE | Admit: 2021-12-14 | Discharge: 2021-12-14 | Disposition: A | Discharge status: Home / Self Care | Payer: BC Managed Care – PPO | Source: Ambulatory Visit | Attending: Obstetrics and Gynecology | Admitting: Obstetrics and Gynecology

## 2021-12-14 ENCOUNTER — Encounter: Payer: Self-pay | Admitting: Obstetrics and Gynecology

## 2021-12-14 ENCOUNTER — Ambulatory Visit (INDEPENDENT_AMBULATORY_CARE_PROVIDER_SITE_OTHER): Payer: BC Managed Care – PPO | Admitting: Obstetrics and Gynecology

## 2021-12-14 VITALS — BP 122/80 | Ht 67.0 in | Wt 223.0 lb

## 2021-12-14 DIAGNOSIS — Z1159 Encounter for screening for other viral diseases: Secondary | ICD-10-CM | POA: Insufficient documentation

## 2021-12-14 DIAGNOSIS — Z3189 Encounter for other procreative management: Secondary | ICD-10-CM | POA: Diagnosis not present

## 2021-12-14 DIAGNOSIS — Z0000 Encounter for general adult medical examination without abnormal findings: Secondary | ICD-10-CM | POA: Insufficient documentation

## 2021-12-14 DIAGNOSIS — Z01419 Encounter for gynecological examination (general) (routine) without abnormal findings: Secondary | ICD-10-CM

## 2021-12-14 DIAGNOSIS — L309 Dermatitis, unspecified: Secondary | ICD-10-CM | POA: Insufficient documentation

## 2021-12-14 DIAGNOSIS — Z114 Encounter for screening for human immunodeficiency virus [HIV]: Secondary | ICD-10-CM | POA: Insufficient documentation

## 2021-12-14 NOTE — Progress Notes (Signed)
Subjective:     Kim Thompson is a 41 y.o. female P0 with LMP 11/30/21 and BMI 34 who is here for a comprehensive physical exam. The patient reports no problems. She reports a monthly period lasting 5-6 days. She is sexually active without complaints. She desires to conceive and is not using any contraception. She is currently not in a relationship and is interested in using a sperm donor. Patient denies pelvic pain or abnormal discharge.   History reviewed. No pertinent past medical history. Past Surgical History:  Procedure Laterality Date   ADENOIDECTOMY  1994   CHOLECYSTECTOMY  2003   MYRINGOTOMY WITH TUBE PLACEMENT Bilateral    X's three times.   Family History  Problem Relation Age of Onset   Irregular heart beat Mother    Asthma Mother    Asthma Father    Asthma Brother    Healthy Maternal Grandmother    Osteoporosis Maternal Grandmother    Aortic aneurysm Maternal Grandfather    Diabetes Paternal Grandmother    Breast cancer Paternal Grandmother    Heart attack Paternal Grandmother    Stroke Paternal Grandmother    Hypertension Paternal Grandfather    Stroke Paternal Grandfather    Colon cancer Maternal Uncle    Diabetes Maternal Uncle     Social History   Socioeconomic History   Marital status: Single    Spouse name: Not on file   Number of children: Not on file   Years of education: Not on file   Highest education level: Not on file  Occupational History   Occupation: Full time    Employer: [employer]  Tobacco Use   Smoking status: Never   Smokeless tobacco: Never  Vaping Use   Vaping Use: Never used  Substance and Sexual Activity   Alcohol use: Yes    Comment: Very Rarely   Drug use: No   Sexual activity: Yes    Partners: Male    Birth control/protection: Condom  Other Topics Concern   Not on file  Social History Narrative   Not on file   Social Determinants of Health   Financial Resource Strain: Not on file  Food Insecurity: Not on  file  Transportation Needs: Not on file  Physical Activity: Not on file  Stress: Not on file  Social Connections: Not on file  Intimate Partner Violence: Not on file   Health Maintenance  Topic Date Due   COVID-19 Vaccine (1) 12/30/2021 (Originally 03/05/1981)   INFLUENZA VACCINE  02/06/2022   PAP SMEAR-Modifier  04/01/2022   TETANUS/TDAP  02/06/2025   Hepatitis C Screening  Completed   HIV Screening  Completed   HPV VACCINES  Aged Out       Review of Systems Pertinent items noted in HPI and remainder of comprehensive ROS otherwise negative.   Objective:   Today's Vitals   12/14/21 1409  BP: 122/80  Weight: 223 lb (101.2 kg)  Height: 5\' 7"  (1.702 m)   Body mass index is 34.93 kg/m.  GENERAL: Well-developed, well-nourished female in no acute distress.  HEENT: Normocephalic, atraumatic. Sclerae anicteric.  NECK: Supple. Normal thyroid.  LUNGS: Clear to auscultation bilaterally.  HEART: Regular rate and rhythm. BREASTS: Symmetric in size. No palpable masses or lymphadenopathy, skin changes, or nipple drainage. ABDOMEN: Soft, nontender, nondistended. No organomegaly. PELVIC: Normal external female genitalia. Vagina is pink and rugated.  Normal discharge. Normal appearing cervix. Uterus is normal in size. No adnexal mass or tenderness. Chaperone present during the pelvic  exam EXTREMITIES: No cyanosis, clubbing, or edema, 2+ distal pulses.     Assessment:    Healthy female exam.      Plan:    Pap smear collected STI screening per patient request Health maintenance labs ordered Patient referred to Dr. Kerin Perna for fertility counseling and IUI Patient advised to start taking prenatal vitamins See After Visit Summary for Counseling Recommendations

## 2021-12-15 LAB — COMPREHENSIVE METABOLIC PANEL
ALT: 18 IU/L (ref 0–32)
AST: 16 IU/L (ref 0–40)
Albumin/Globulin Ratio: 2.1 (ref 1.2–2.2)
Albumin: 4.4 g/dL (ref 3.8–4.8)
Alkaline Phosphatase: 46 IU/L (ref 44–121)
BUN/Creatinine Ratio: 16 (ref 9–23)
BUN: 11 mg/dL (ref 6–20)
Bilirubin Total: <0.2 mg/dL (ref 0.0–1.2)
CO2: 21 mmol/L (ref 20–29)
Calcium: 9.2 mg/dL (ref 8.7–10.2)
Chloride: 102 mmol/L (ref 96–106)
Creatinine, Ser: 0.67 mg/dL (ref 0.57–1.00)
Globulin, Total: 2.1 g/dL (ref 1.5–4.5)
Glucose: 81 mg/dL (ref 70–99)
Potassium: 4.2 mmol/L (ref 3.5–5.2)
Sodium: 138 mmol/L (ref 134–144)
Total Protein: 6.5 g/dL (ref 6.0–8.5)
eGFR: 114 mL/min/{1.73_m2} (ref >59)

## 2021-12-15 LAB — LIPID PANEL
Chol/HDL Ratio: 3.4 ratio (ref 0.0–4.4)
Cholesterol, Total: 187 mg/dL (ref 100–199)
HDL: 55 mg/dL (ref >39)
LDL Chol Calc (NIH): 113 mg/dL — ABNORMAL HIGH (ref 0–99)
Triglycerides: 106 mg/dL (ref 0–149)
VLDL Cholesterol Cal: 19 mg/dL (ref 5–40)

## 2021-12-15 LAB — CBC
Hematocrit: 34 % (ref 34.0–46.6)
Hemoglobin: 11.4 g/dL (ref 11.1–15.9)
MCH: 28 pg (ref 26.6–33.0)
MCHC: 33.5 g/dL (ref 31.5–35.7)
MCV: 84 fL (ref 79–97)
Platelets: 286 10*3/uL (ref 150–450)
RBC: 4.07 x10E6/uL (ref 3.77–5.28)
RDW: 14.7 % (ref 11.7–15.4)
WBC: 7.6 10*3/uL (ref 3.4–10.8)

## 2021-12-15 LAB — HEPATITIS B SURFACE ANTIGEN: Hepatitis B Surface Ag: NEGATIVE

## 2021-12-15 LAB — HEPATITIS C ANTIBODY: Hep C Virus Ab: NONREACTIVE

## 2021-12-15 LAB — HIV ANTIBODY (ROUTINE TESTING W REFLEX): HIV Screen 4th Generation wRfx: NONREACTIVE

## 2021-12-15 LAB — HEMOGLOBIN A1C
Est. average glucose Bld gHb Est-mCnc: 111 mg/dL
Hgb A1c MFr Bld: 5.5 % (ref 4.8–5.6)

## 2021-12-15 LAB — RPR: RPR Ser Ql: NONREACTIVE

## 2021-12-15 LAB — TSH: TSH: 1.59 u[IU]/mL (ref 0.450–4.500)

## 2021-12-18 LAB — CYTOLOGY - PAP
Chlamydia: NEGATIVE
Comment: NEGATIVE
Comment: NORMAL
Diagnosis: NEGATIVE
Neisseria Gonorrhea: NEGATIVE

## 2022-01-01 DIAGNOSIS — J45998 Other asthma: Secondary | ICD-10-CM | POA: Diagnosis not present

## 2022-01-08 ENCOUNTER — Other Ambulatory Visit: Payer: Self-pay | Admitting: Physician Assistant

## 2022-01-08 DIAGNOSIS — J45909 Unspecified asthma, uncomplicated: Secondary | ICD-10-CM

## 2022-01-08 NOTE — Telephone Encounter (Signed)
Medication Refill - Medication: Albuterol Sulfate Inhalation system 1.25 MG/ 3 MIL   Has the patient contacted their pharmacy? Yes.   (Agent: If no, request that the patient contact the pharmacy for the refill. If patient does not wish to contact the pharmacy document the reason why and proceed with request.) (Agent: If yes, when and what did the pharmacy advise?)  Preferred Pharmacy (with phone number or street name):  [facility] [phone] Dan Humphreys, [place] - [address]  [address] [place] [address]  Phone: [phone] Fax: [phone]   Has the patient been seen for an appointment in the last year OR does the patient have an upcoming appointment? Yes.    Agent: Please be advised that RX refills may take up to 3 business days. We ask that you follow-up with your pharmacy.

## 2022-01-10 ENCOUNTER — Ambulatory Visit: Payer: BC Managed Care – PPO | Admitting: Obstetrics and Gynecology

## 2022-01-10 DIAGNOSIS — J45998 Other asthma: Secondary | ICD-10-CM | POA: Diagnosis not present

## 2022-01-10 MED ORDER — ALBUTEROL SULFATE HFA 108 (90 BASE) MCG/ACT IN AERS: INHALATION_SPRAY | RESPIRATORY_TRACT | 0 refills | Status: DC

## 2022-01-10 NOTE — Telephone Encounter (Signed)
Requested Prescriptions  Pending Prescriptions Disp Refills  . albuterol (VENTOLIN HFA) 108 (90 Base) MCG/ACT inhaler 8.5 each 0    Sig: INHALE 1-2 PUFFS INTO THE LUNGS EVERY 4 HOURS AS NEEDED FOR WHEEZING OR SHORTNESS OF BREATH.     Pulmonology:  Beta Agonists 2 Passed - 01/08/2022  1:50 PM      Passed - Last BP in normal range    BP Readings from Last 1 Encounters:  12/14/21 122/80         Passed - Last Heart Rate in normal range    Pulse Readings from Last 1 Encounters:  12/12/21 88         Passed - Valid encounter within last 12 months    Recent Outpatient Visits          4 weeks ago Migraine with aura and without status migrainosus, not intractable   [facility] [place], [place], PA-C   1 year ago Annual physical exam   [facility] [place], Lavella Hammock, [place]   2 years ago Annual physical exam   [facility] Trey Sailors, [place]   3 years ago Annual physical exam   [facility] Osvaldo Angst M, [place]   4 years ago Annual physical exam   [facility] Trey Sailors, [place]      Future Appointments            In 3 weeks [name], PA-C [employer]

## 2022-01-26 DIAGNOSIS — S92345A Nondisplaced fracture of fourth metatarsal bone, left foot, initial encounter for closed fracture: Secondary | ICD-10-CM | POA: Diagnosis not present

## 2022-01-30 NOTE — Progress Notes (Unsigned)
   Vivien Rota DeSanto,acting as a Neurosurgeon for [employer], PA-C.,have documented all relevant documentation on the behalf of [name], PA-C,as directed by  [employer], PA-C while in the presence of [employer], PA-C.     Established patient visit   Patient: Kim Thompson   DOB: 1981-05-12   41 y.o. Female  MRN: [id number] Visit Date: 01/31/2022  Today's healthcare provider: [name], PA-C   No chief complaint on file.  Subjective    HPI    Medications: Outpatient Medications Prior to Visit  Medication Sig   ADVAIR DISKUS 100-50 MCG/DOSE AEPB TAKE 1 PUFF BY MOUTH TWICE A DAY   albuterol (VENTOLIN HFA) 108 (90 Base) MCG/ACT inhaler INHALE 1-2 PUFFS INTO THE LUNGS EVERY 4 HOURS AS NEEDED FOR WHEEZING OR SHORTNESS OF BREATH.   fluticasone (FLONASE) 50 MCG/ACT nasal spray Place 2 sprays into both nostrils daily.   fluticasone-salmeterol (ADVAIR) 100-50 MCG/ACT AEPB Inhale 1 puff into the lungs 2 (two) times daily.   montelukast (SINGULAIR) 10 MG tablet Take 1 tablet (10 mg total) by mouth at bedtime.   omeprazole (PRILOSEC) 20 MG capsule Take 20 mg by mouth daily.   Spacer/Aero-Holding Chambers (AEROCHAMBER PLUS) inhaler Use with inhaler   SUMAtriptan (IMITREX) 100 MG tablet TAKE 1 TABLET BY MOUTH (MAY REPEAT IN 2 HOURS IF HEADACHE PERSISTS OR RECURS)   No facility-administered medications prior to visit.    Review of Systems  {Labs  Heme  Chem  Endocrine  Serology  Results Review (optional):23779}   Objective    There were no vitals taken for this visit. {Show previous vital signs (optional):23777}  Physical Exam  ***  No results found for any visits on 01/31/22.  Assessment & Plan     ***  No follow-ups on file.      {provider attestation***:1}   [name], Cordelia Poche  [facility] [phone] (phone) [phone] (fax)  [facility]

## 2022-01-31 ENCOUNTER — Encounter: Payer: Self-pay | Admitting: Physician Assistant

## 2022-01-31 ENCOUNTER — Ambulatory Visit: Payer: BC Managed Care – PPO | Admitting: Physician Assistant

## 2022-01-31 VITALS — BP 125/87 | HR 103 | Temp 98.0°F | Resp 16 | Wt 223.0 lb

## 2022-01-31 DIAGNOSIS — R12 Heartburn: Secondary | ICD-10-CM | POA: Diagnosis not present

## 2022-01-31 DIAGNOSIS — E7800 Pure hypercholesterolemia, unspecified: Secondary | ICD-10-CM

## 2022-01-31 DIAGNOSIS — G43109 Migraine with aura, not intractable, without status migrainosus: Secondary | ICD-10-CM

## 2022-01-31 DIAGNOSIS — J45909 Unspecified asthma, uncomplicated: Secondary | ICD-10-CM

## 2022-01-31 DIAGNOSIS — E669 Obesity, unspecified: Secondary | ICD-10-CM

## 2022-01-31 DIAGNOSIS — Z1329 Encounter for screening for other suspected endocrine disorder: Secondary | ICD-10-CM

## 2022-01-31 DIAGNOSIS — L309 Dermatitis, unspecified: Secondary | ICD-10-CM

## 2022-02-05 DIAGNOSIS — S92345A Nondisplaced fracture of fourth metatarsal bone, left foot, initial encounter for closed fracture: Secondary | ICD-10-CM | POA: Diagnosis not present

## 2022-02-08 ENCOUNTER — Encounter: Payer: BC Managed Care – PPO | Admitting: Obstetrics and Gynecology

## 2022-03-06 DIAGNOSIS — S92345A Nondisplaced fracture of fourth metatarsal bone, left foot, initial encounter for closed fracture: Secondary | ICD-10-CM | POA: Diagnosis not present

## 2022-04-03 DIAGNOSIS — J069 Acute upper respiratory infection, unspecified: Secondary | ICD-10-CM | POA: Diagnosis not present

## 2022-04-15 DIAGNOSIS — J45901 Unspecified asthma with (acute) exacerbation: Secondary | ICD-10-CM | POA: Diagnosis not present

## 2022-04-19 DIAGNOSIS — S92342D Displaced fracture of fourth metatarsal bone, left foot, subsequent encounter for fracture with routine healing: Secondary | ICD-10-CM | POA: Diagnosis not present

## 2022-05-03 ENCOUNTER — Ambulatory Visit: Payer: BC Managed Care – PPO | Admitting: Physician Assistant

## 2022-05-15 ENCOUNTER — Ambulatory Visit: Payer: BC Managed Care – PPO | Admitting: Physician Assistant

## 2022-05-15 ENCOUNTER — Encounter: Payer: Self-pay | Admitting: Physician Assistant

## 2022-05-15 VITALS — BP 120/54 | HR 74 | Resp 16 | Wt 244.0 lb

## 2022-05-15 DIAGNOSIS — J302 Other seasonal allergic rhinitis: Secondary | ICD-10-CM

## 2022-05-15 DIAGNOSIS — L309 Dermatitis, unspecified: Secondary | ICD-10-CM

## 2022-05-15 DIAGNOSIS — R12 Heartburn: Secondary | ICD-10-CM

## 2022-05-15 DIAGNOSIS — G43109 Migraine with aura, not intractable, without status migrainosus: Secondary | ICD-10-CM

## 2022-05-15 DIAGNOSIS — J45909 Unspecified asthma, uncomplicated: Secondary | ICD-10-CM | POA: Diagnosis not present

## 2022-05-15 DIAGNOSIS — E7800 Pure hypercholesterolemia, unspecified: Secondary | ICD-10-CM

## 2022-05-15 MED ORDER — CETIRIZINE HCL 10 MG PO TABS: 10.0000 mg | ORAL_TABLET | Freq: Every day | ORAL | 11 refills | Status: AC

## 2022-05-15 MED ORDER — FLUTICASONE PROPIONATE 50 MCG/ACT NA SUSP: 2.0000 | Freq: Every day | NASAL | 6 refills | Status: AC

## 2022-05-15 NOTE — Progress Notes (Unsigned)
I,Kim Thompson,acting as a Neurosurgeon for [employer], PA-C.,have documented all relevant documentation on the behalf of Kim Lat, PA-C,as directed by  [employer], PA-C while in the presence of [employer], PA-C.   Established patient visit   Patient: Kim Thompson   DOB: [birth year]   41 y.o. Female  MRN: [id number] Visit Date: 05/15/2022  Today's healthcare provider: [name], PA-C   Chief Complaint  Patient presents with   Follow-up   Subjective    HPI  Patient is here for 3 month follow up on chronic problems. Had a covid and bronchitis a mo ago. Took prednisone x 5 days, cough suppressant. She is doing better but some symptoms still lingering Endorses having  some headache and sinus pressure, nasal congestion, wheezing at night and postnasal drainage  Medications: Outpatient Medications Prior to Visit  Medication Sig   albuterol (VENTOLIN HFA) 108 (90 Base) MCG/ACT inhaler INHALE 1-2 PUFFS INTO THE LUNGS EVERY 4 HOURS AS NEEDED FOR WHEEZING OR SHORTNESS OF BREATH.   fluticasone-salmeterol (ADVAIR) 100-50 MCG/ACT AEPB Inhale 1 puff into the lungs 2 (two) times daily.   montelukast (SINGULAIR) 10 MG tablet Take 1 tablet (10 mg total) by mouth at bedtime.   omeprazole (PRILOSEC) 20 MG capsule Take 20 mg by mouth daily.   SUMAtriptan (IMITREX) 100 MG tablet TAKE 1 TABLET BY MOUTH (MAY REPEAT IN 2 HOURS IF HEADACHE PERSISTS OR RECURS)   ADVAIR DISKUS 100-50 MCG/DOSE AEPB TAKE 1 PUFF BY MOUTH TWICE A DAY (Patient not taking: Reported on 05/15/2022)   Spacer/Aero-Holding Chambers (AEROCHAMBER PLUS) inhaler Use with inhaler (Patient not taking: Reported on 05/15/2022)   [DISCONTINUED] fluticasone (FLONASE) 50 MCG/ACT nasal spray Place 2 sprays into both nostrils daily. (Patient not taking: Reported on 05/15/2022)   No facility-administered medications prior to visit.    Review of Systems  Constitutional:  Negative for appetite change, chills, fatigue and fever.   HENT:  Positive for congestion, postnasal drip and sinus pressure. Negative for ear discharge and ear pain.   Eyes:  Positive for redness and visual disturbance. Negative for pain, discharge and itching.  Respiratory:  Negative for chest tightness and shortness of breath.   Cardiovascular:  Negative for chest pain and palpitations.  Gastrointestinal:  Negative for abdominal pain, nausea and vomiting.  Neurological:  Negative for dizziness and weakness.    {Labs  Heme  Chem  Endocrine  Serology  Results Review (optional):23779}   Objective    BP (!) 120/54 (BP Location: Left Arm, Patient Position: Sitting, Cuff Size: Large)   Pulse 74   Resp 16   Wt 244 lb (110.7 kg)   SpO2 98%   BMI 38.22 kg/m  {Show previous vital signs (optional):23777}  Physical Exam Vitals reviewed.  Constitutional:      General: She is not in acute distress.    Appearance: Normal appearance. She is well-developed. She is not diaphoretic.  HENT:     Head: Normocephalic and atraumatic.     Nose: Congestion (mild) and rhinorrhea present.     Mouth/Throat:     Comments: Postnasal drainage Eyes:     General: No scleral icterus.       Right eye: No discharge.        Left eye: No discharge.     Extraocular Movements: Extraocular movements intact.     Pupils: Pupils are equal, round, and reactive to light.     Comments: Injected conjunctiva, bilateral  Neck:     Thyroid:  No thyromegaly.  Cardiovascular:     Rate and Rhythm: Normal rate and regular rhythm.     Pulses: Normal pulses.     Heart sounds: Normal heart sounds. No murmur heard. Pulmonary:     Effort: Pulmonary effort is normal. No respiratory distress.     Breath sounds: Normal breath sounds. No wheezing, rhonchi or rales.  Musculoskeletal:     Cervical back: Neck supple.     Right lower leg: No edema.     Left lower leg: No edema.  Lymphadenopathy:     Cervical: No cervical adenopathy.  Skin:    General: Skin is warm and dry.      Findings: No rash.  Neurological:     Mental Status: She is alert and oriented to person, place, and time. Mental status is at baseline.  Psychiatric:        Mood and Affect: Mood normal.        Behavior: Behavior normal.     ***  No results found for any visits on 05/15/22.  Assessment & Plan     1. Migraine with aura and without status migrainosus, not intractable Resolved. Pt has not having any symptoms anymore Pt was advised to see Neurology for an assessment  2. Asthma, unspecified asthma severity, unspecified whether complicated, unspecified whether persistent Continue with advair, albuterol PRN, singulair Advised to add - cetirizine (ZYRTEC) 10 MG tablet; Take 1 tablet (10 mg total) by mouth daily.  Dispense: 30 tablet; Refill: 11 Pt was advised to schedule an appt with her Allergist for management  3. Heartburn Chronic and stable. Continue PPI Elevate the head of the bed 6-8 inches, avoid recumbency for 3 hours after eating, avoid food as a delayed gastric emptying, weight loss   Will monitor  4. Eczema, unspecified type In remission. Pt changes a soap she has been using at work and symptoms  Improved/resolved. Continue to avoid any trigger substances Advised to use topical steroids PRN At this point, pt declined a referral to dermatology Will monitor  5. Elevated LDL cholesterol level Chronic and stable Weight loss advised via low-carb and low-fat diet and daily exercise?/pt has a foot fracture. Managed by [facility] Continue taking OTC omega-3   6. Seasonal allergies Symptomatic treatment advised: antihistamines Increase fluids. Saline nasal spray. .  Humidifier in bedroom. Flonase per orders.  Call or return to clinic if symptoms are not improving.   - fluticasone (FLONASE) 50 MCG/ACT nasal spray; Place 2 sprays into both nostrils daily.  Dispense: 16 g; Refill: 6 - cetirizine (ZYRTEC) 10 MG tablet; Take 1 tablet (10 mg total) by mouth daily.  Dispense:  30 tablet; Refill: 11  BP is low today but pt endorses that she is feeling well Pt was asked to check her BP at home for the next two days and report her findings We will monitor at this point  Pt asked for a flu vaccine but considering a recent Covid infection and current allergic sinusitis, pt was advised to hold on a flu shot.  FU in 6 weeks     The patient was advised to call back or seek an in-person evaluation if the symptoms worsen or if the condition fails to improve as anticipated.  I discussed the assessment and treatment plan with the patient. The patient was provided an opportunity to ask questions and all were answered. The patient agreed with the plan and demonstrated an understanding of the instructions.  The entirety of the information documented in  the History of Present Illness, Review of Systems and Physical Exam were personally obtained by me. Portions of this information were initially documented by the CMA and reviewed by me for thoroughness and accuracy.  Portions of this note were created using dictation software and may contain typographical errors.     Kim Lat, PA-C  [facility] [phone] (phone) [phone] (fax)  [facility]

## 2022-05-16 DIAGNOSIS — E7800 Pure hypercholesterolemia, unspecified: Secondary | ICD-10-CM | POA: Insufficient documentation

## 2022-05-16 DIAGNOSIS — J302 Other seasonal allergic rhinitis: Secondary | ICD-10-CM | POA: Insufficient documentation

## 2022-06-18 ENCOUNTER — Other Ambulatory Visit: Payer: Self-pay | Admitting: Physician Assistant

## 2022-06-18 DIAGNOSIS — J45909 Unspecified asthma, uncomplicated: Secondary | ICD-10-CM

## 2022-06-25 NOTE — Progress Notes (Deleted)
Established Patient Office Visit  Subjective   Patient ID: Kim Thompson, female    DOB: [birth year]  Age: 41 y.o. MRN: [id number]  No chief complaint on file.   HPI  {History (Optional):23778}  ROS    Objective:     There were no vitals taken for this visit. {Vitals History (Optional):23777}  Physical Exam   No results found for any visits on 06/26/22.  {Labs (Optional):23779}  The ASCVD Risk score (Arnett DK, et al., 2019) failed to calculate for the following reasons:   The 2019 ASCVD risk score is only valid for ages 18 to 37    Assessment & Plan:   Problem List Items Addressed This Visit   None   Migraine with aura and without status migrainosus, not intractable Resolved. Pt has not having any symptoms  Pt was advised to see Neurology for an assessment  if symptoms persist    Asthma, unspecified asthma severity, unspecified whether complicated, unspecified whether persistent Continue with Advair 100-50 mcg, albuterol PRN, Singulair 10 mg Advised to add - cetirizine (ZYRTEC) 10 MG tablet; Take 1 tablet (10 mg total) by mouth daily.  Dispense: 30 tablet; Refill: 11 Pt was advised to schedule an appt with her Allergist for management and assessment/PFT    Heartburn Chronic and stable. Continue PPI/prilosec 20mg  Elevate the head of the bed 6-8 inches, avoid recumbency for 3 hours after eating, avoid food as a delayed gastric emptying, weight loss   Will monitor    Eczema, unspecified type In remission. Pt changes a soap she has been using at work and symptoms  Improved/resolved. Continue to avoid any trigger substances Advised to use topical steroids PRN At this point, pt declined a referral to dermatology Will monitor   Elevated LDL cholesterol level Chronic and stable Weight loss advised via low-carb and low-fat diet and daily exercise?/pt had a foot fracture. Managed by [facility] Continue taking OTC omega-3     Seasonal  allergies Symptomatic treatment advised: antihistamines Increase fluids. Saline nasal spray. Humidifier in bedroom. Flonase per orders.  Call or return to clinic if symptoms are not improving.   - fluticasone (FLONASE) 50 MCG/ACT nasal spray; Place 2 sprays into both nostrils daily.  Dispense: 16 g; Refill: 6 - cetirizine (ZYRTEC) 10 MG tablet; Take 1 tablet (10 mg total) by mouth daily.  Dispense: 30 tablet; Refill: 11   BP is low today could be due to her allergies/sinusitis but pt endorses that she is feeling well Pt was asked to check her BP at home for the next two days and report her findings We will monitor at this point   Pt asked for a flu vaccine but considering a recent Covid infection and current allergic sinusitis, pt was advised to hold on a flu shot. Labs were done on 12/14/21 Needs flu and check with [place] registry about DTap FU in 6 weeks       The patient was advised to call back or seek an in-person evaluation if the symptoms worsen or if the condition fails to improve as anticipated.   I discussed the assessment and treatment plan with the patient. The patient was provided an opportunity to ask questions and all were answered. The patient agreed with the plan and demonstrated an understanding of the instructions.   The entirety of the information documented in the History of Present Illness, Review of Systems and Physical Exam were personally obtained by me. Portions of this information were initially documented by the  CMA and reviewed by me for thoroughness and accuracy.  Portions of this note were created using dictation software and may contain typographical errors.        [name], PA-C  [facility] [phone] (phone) [phone] (fax)   [facility] No follow-ups on file.    [name], PA-C

## 2022-06-26 ENCOUNTER — Ambulatory Visit: Payer: BC Managed Care – PPO | Admitting: Physician Assistant

## 2022-06-26 DIAGNOSIS — J45909 Unspecified asthma, uncomplicated: Secondary | ICD-10-CM

## 2022-06-26 DIAGNOSIS — J302 Other seasonal allergic rhinitis: Secondary | ICD-10-CM

## 2022-06-26 DIAGNOSIS — R12 Heartburn: Secondary | ICD-10-CM

## 2022-06-26 DIAGNOSIS — E669 Obesity, unspecified: Secondary | ICD-10-CM

## 2022-06-26 DIAGNOSIS — G43109 Migraine with aura, not intractable, without status migrainosus: Secondary | ICD-10-CM

## 2022-06-26 DIAGNOSIS — L309 Dermatitis, unspecified: Secondary | ICD-10-CM

## 2022-08-15 DIAGNOSIS — T783XXA Angioneurotic edema, initial encounter: Secondary | ICD-10-CM | POA: Diagnosis not present

## 2022-08-15 DIAGNOSIS — J4530 Mild persistent asthma, uncomplicated: Secondary | ICD-10-CM | POA: Diagnosis not present

## 2022-08-15 DIAGNOSIS — J301 Allergic rhinitis due to pollen: Secondary | ICD-10-CM | POA: Diagnosis not present

## 2022-08-15 DIAGNOSIS — Z79899 Other long term (current) drug therapy: Secondary | ICD-10-CM | POA: Diagnosis not present

## 2022-08-15 DIAGNOSIS — B379 Candidiasis, unspecified: Secondary | ICD-10-CM | POA: Diagnosis not present

## 2022-08-21 ENCOUNTER — Other Ambulatory Visit: Payer: Self-pay | Admitting: Family Medicine

## 2022-08-21 DIAGNOSIS — Z1231 Encounter for screening mammogram for malignant neoplasm of breast: Secondary | ICD-10-CM

## 2022-11-19 DIAGNOSIS — J301 Allergic rhinitis due to pollen: Secondary | ICD-10-CM | POA: Diagnosis not present

## 2022-11-19 DIAGNOSIS — T783XXA Angioneurotic edema, initial encounter: Secondary | ICD-10-CM | POA: Diagnosis not present

## 2022-11-19 DIAGNOSIS — R198 Other specified symptoms and signs involving the digestive system and abdomen: Secondary | ICD-10-CM | POA: Diagnosis not present

## 2022-11-19 DIAGNOSIS — J3089 Other allergic rhinitis: Secondary | ICD-10-CM | POA: Diagnosis not present

## 2022-11-19 DIAGNOSIS — J4530 Mild persistent asthma, uncomplicated: Secondary | ICD-10-CM | POA: Diagnosis not present

## 2022-12-05 ENCOUNTER — Other Ambulatory Visit: Payer: Self-pay | Admitting: Physician Assistant

## 2022-12-05 DIAGNOSIS — J3089 Other allergic rhinitis: Secondary | ICD-10-CM

## 2022-12-05 DIAGNOSIS — J45909 Unspecified asthma, uncomplicated: Secondary | ICD-10-CM

## 2022-12-06 ENCOUNTER — Ambulatory Visit
Admission: RE | Admit: 2022-12-06 | Discharge: 2022-12-06 | Disposition: A | Discharge status: Home / Self Care | Payer: BC Managed Care – PPO | Source: Ambulatory Visit | Attending: Family Medicine | Admitting: Family Medicine

## 2022-12-06 DIAGNOSIS — Z1231 Encounter for screening mammogram for malignant neoplasm of breast: Secondary | ICD-10-CM | POA: Diagnosis not present

## 2023-01-01 ENCOUNTER — Other Ambulatory Visit: Payer: Self-pay | Admitting: Physician Assistant

## 2023-01-01 DIAGNOSIS — J45909 Unspecified asthma, uncomplicated: Secondary | ICD-10-CM

## 2023-01-01 DIAGNOSIS — J3089 Other allergic rhinitis: Secondary | ICD-10-CM

## 2023-01-02 ENCOUNTER — Other Ambulatory Visit: Payer: Self-pay | Admitting: Physician Assistant

## 2023-01-02 DIAGNOSIS — J45909 Unspecified asthma, uncomplicated: Secondary | ICD-10-CM

## 2023-01-03 NOTE — Telephone Encounter (Signed)
Requested Prescriptions  Pending Prescriptions Disp Refills   albuterol (VENTOLIN HFA) 108 (90 Base) MCG/ACT inhaler [Pharmacy Med Name: ALBUTEROL HFA (PROAIR) INHALER] 8.5 each 3    Sig: INHALE 1-2 PUFFS INTO THE LUNGS EVERY 4 HOURS AS NEEDED FOR WHEEZING OR SHORTNESS OF BREATH.     Pulmonology:  Beta Agonists 2 Passed - 01/02/2023  4:25 PM      Passed - Last BP in normal range    BP Readings from Last 1 Encounters:  05/15/22 (!) 120/54         Passed - Last Heart Rate in normal range    Pulse Readings from Last 1 Encounters:  05/15/22 74         Passed - Valid encounter within last 12 months    Recent Outpatient Visits           7 months ago Seasonal allergies   [facility] [place], [place], PA-C   11 months ago Migraine with aura and without status migrainosus, not intractable   [facility] [place], [place], PA-C   1 year ago Migraine with aura and without status migrainosus, not intractable   [facility] [place], [place], PA-C   2 years ago Annual physical exam   [facility] [place], Lavella Hammock, [place]   3 years ago Annual physical exam   [facility] [place], Ricki Rodriguez [place], [place]

## 2023-01-17 DIAGNOSIS — Z9109 Other allergy status, other than to drugs and biological substances: Secondary | ICD-10-CM | POA: Diagnosis not present

## 2023-01-24 DIAGNOSIS — Z9109 Other allergy status, other than to drugs and biological substances: Secondary | ICD-10-CM | POA: Diagnosis not present

## 2023-01-31 DIAGNOSIS — Z9109 Other allergy status, other than to drugs and biological substances: Secondary | ICD-10-CM | POA: Diagnosis not present

## 2023-02-07 DIAGNOSIS — Z9109 Other allergy status, other than to drugs and biological substances: Secondary | ICD-10-CM | POA: Diagnosis not present

## 2023-02-14 DIAGNOSIS — Z9109 Other allergy status, other than to drugs and biological substances: Secondary | ICD-10-CM | POA: Diagnosis not present

## 2023-02-16 NOTE — Telephone Encounter (Signed)
err

## 2023-02-21 DIAGNOSIS — Z9109 Other allergy status, other than to drugs and biological substances: Secondary | ICD-10-CM | POA: Diagnosis not present

## 2023-02-26 DIAGNOSIS — Z9109 Other allergy status, other than to drugs and biological substances: Secondary | ICD-10-CM | POA: Diagnosis not present

## 2023-03-05 DIAGNOSIS — Z9109 Other allergy status, other than to drugs and biological substances: Secondary | ICD-10-CM | POA: Diagnosis not present

## 2023-03-12 DIAGNOSIS — Z9109 Other allergy status, other than to drugs and biological substances: Secondary | ICD-10-CM | POA: Diagnosis not present

## 2023-03-19 DIAGNOSIS — Z9109 Other allergy status, other than to drugs and biological substances: Secondary | ICD-10-CM | POA: Diagnosis not present

## 2023-03-26 DIAGNOSIS — Z9109 Other allergy status, other than to drugs and biological substances: Secondary | ICD-10-CM | POA: Diagnosis not present

## 2023-04-02 DIAGNOSIS — Z9109 Other allergy status, other than to drugs and biological substances: Secondary | ICD-10-CM | POA: Diagnosis not present

## 2023-04-09 DIAGNOSIS — J3089 Other allergic rhinitis: Secondary | ICD-10-CM | POA: Diagnosis not present

## 2023-04-23 DIAGNOSIS — Z9109 Other allergy status, other than to drugs and biological substances: Secondary | ICD-10-CM | POA: Diagnosis not present

## 2023-04-26 DIAGNOSIS — H6690 Otitis media, unspecified, unspecified ear: Secondary | ICD-10-CM | POA: Diagnosis not present

## 2023-04-30 DIAGNOSIS — Z9109 Other allergy status, other than to drugs and biological substances: Secondary | ICD-10-CM | POA: Diagnosis not present

## 2023-05-07 DIAGNOSIS — Z9109 Other allergy status, other than to drugs and biological substances: Secondary | ICD-10-CM | POA: Diagnosis not present

## 2023-05-14 DIAGNOSIS — Z9109 Other allergy status, other than to drugs and biological substances: Secondary | ICD-10-CM | POA: Diagnosis not present

## 2023-05-21 DIAGNOSIS — Z9109 Other allergy status, other than to drugs and biological substances: Secondary | ICD-10-CM | POA: Diagnosis not present

## 2023-05-28 DIAGNOSIS — Z9109 Other allergy status, other than to drugs and biological substances: Secondary | ICD-10-CM | POA: Diagnosis not present

## 2023-06-11 DIAGNOSIS — Z9109 Other allergy status, other than to drugs and biological substances: Secondary | ICD-10-CM | POA: Diagnosis not present

## 2023-06-18 DIAGNOSIS — Z9109 Other allergy status, other than to drugs and biological substances: Secondary | ICD-10-CM | POA: Diagnosis not present

## 2023-06-28 ENCOUNTER — Other Ambulatory Visit: Payer: Self-pay | Admitting: Physician Assistant

## 2023-06-28 DIAGNOSIS — J3089 Other allergic rhinitis: Secondary | ICD-10-CM

## 2023-06-28 DIAGNOSIS — Z9109 Other allergy status, other than to drugs and biological substances: Secondary | ICD-10-CM | POA: Diagnosis not present

## 2023-06-28 DIAGNOSIS — J45909 Unspecified asthma, uncomplicated: Secondary | ICD-10-CM

## 2023-06-28 NOTE — Telephone Encounter (Signed)
Requested medication (s) are due for refill today: Yes  Requested medication (s) are on the active medication list: Yes  Last refill:  01/02/23  Future visit scheduled: No  Notes to clinic:  Unable to refill per protocol, appointment needed. Patient notified via MyChart message     Requested Prescriptions  Pending Prescriptions Disp Refills   montelukast (SINGULAIR) 10 MG tablet [Pharmacy Med Name: MONTELUKAST SOD 10 MG TABLET] 90 tablet 1    Sig: TAKE 1 TABLET BY MOUTH EVERYDAY AT BEDTIME     Pulmonology:  Leukotriene Inhibitors Failed - 06/28/2023 12:01 PM      Failed - Valid encounter within last 12 months    Recent Outpatient Visits           1 year ago Seasonal allergies   [facility] [place], [place], PA-C   1 year ago Migraine with aura and without status migrainosus, not intractable   [place] [facility] [place], [place], PA-C   1 year ago Migraine with aura and without status migrainosus, not intractable    [facility] [place], [place], PA-C   2 years ago Annual physical exam   [facility] [place], Ricki Rodriguez M, [place]   4 years ago Annual physical exam   [facility] [place], Ricki Rodriguez [place], [place]

## 2023-07-06 DIAGNOSIS — J4521 Mild intermittent asthma with (acute) exacerbation: Secondary | ICD-10-CM | POA: Diagnosis not present

## 2023-07-12 DIAGNOSIS — Z9109 Other allergy status, other than to drugs and biological substances: Secondary | ICD-10-CM | POA: Diagnosis not present

## 2023-07-16 DIAGNOSIS — Z9109 Other allergy status, other than to drugs and biological substances: Secondary | ICD-10-CM | POA: Diagnosis not present

## 2023-07-18 DIAGNOSIS — T783XXA Angioneurotic edema, initial encounter: Secondary | ICD-10-CM | POA: Diagnosis not present

## 2023-07-18 DIAGNOSIS — R198 Other specified symptoms and signs involving the digestive system and abdomen: Secondary | ICD-10-CM | POA: Diagnosis not present

## 2023-07-18 DIAGNOSIS — J301 Allergic rhinitis due to pollen: Secondary | ICD-10-CM | POA: Diagnosis not present

## 2023-07-18 DIAGNOSIS — J4530 Mild persistent asthma, uncomplicated: Secondary | ICD-10-CM | POA: Diagnosis not present

## 2023-08-02 DIAGNOSIS — Z9109 Other allergy status, other than to drugs and biological substances: Secondary | ICD-10-CM | POA: Diagnosis not present

## 2023-08-13 DIAGNOSIS — Z9109 Other allergy status, other than to drugs and biological substances: Secondary | ICD-10-CM | POA: Diagnosis not present

## 2023-08-27 DIAGNOSIS — Z9109 Other allergy status, other than to drugs and biological substances: Secondary | ICD-10-CM | POA: Diagnosis not present

## 2023-09-10 DIAGNOSIS — Z9109 Other allergy status, other than to drugs and biological substances: Secondary | ICD-10-CM | POA: Diagnosis not present

## 2023-09-23 DIAGNOSIS — N978 Female infertility of other origin: Secondary | ICD-10-CM | POA: Diagnosis not present

## 2023-09-24 DIAGNOSIS — Z9109 Other allergy status, other than to drugs and biological substances: Secondary | ICD-10-CM | POA: Diagnosis not present

## 2023-10-01 ENCOUNTER — Encounter: Payer: Self-pay | Admitting: Obstetrics and Gynecology

## 2023-10-01 DIAGNOSIS — N979 Female infertility, unspecified: Secondary | ICD-10-CM | POA: Diagnosis not present

## 2023-10-09 DIAGNOSIS — Z9109 Other allergy status, other than to drugs and biological substances: Secondary | ICD-10-CM | POA: Diagnosis not present

## 2023-10-15 DIAGNOSIS — N978 Female infertility of other origin: Secondary | ICD-10-CM | POA: Diagnosis not present

## 2023-10-17 DIAGNOSIS — Z3141 Encounter for fertility testing: Secondary | ICD-10-CM | POA: Diagnosis not present

## 2023-10-29 DIAGNOSIS — Z9109 Other allergy status, other than to drugs and biological substances: Secondary | ICD-10-CM | POA: Diagnosis not present

## 2023-10-31 DIAGNOSIS — N978 Female infertility of other origin: Secondary | ICD-10-CM | POA: Diagnosis not present

## 2023-11-11 DIAGNOSIS — S63631A Sprain of interphalangeal joint of left index finger, initial encounter: Secondary | ICD-10-CM | POA: Diagnosis not present

## 2023-11-18 DIAGNOSIS — J069 Acute upper respiratory infection, unspecified: Secondary | ICD-10-CM | POA: Diagnosis not present

## 2023-11-20 ENCOUNTER — Ambulatory Visit
Admission: RE | Admit: 2023-11-20 | Discharge: 2023-11-20 | Disposition: A | Discharge status: Home / Self Care | Source: Ambulatory Visit | Attending: Physician Assistant | Admitting: Physician Assistant

## 2023-11-20 VITALS — BP 129/87 | HR 88 | Temp 98.7°F | Resp 16

## 2023-11-20 DIAGNOSIS — J029 Acute pharyngitis, unspecified: Secondary | ICD-10-CM

## 2023-11-20 DIAGNOSIS — B370 Candidal stomatitis: Secondary | ICD-10-CM | POA: Diagnosis not present

## 2023-11-20 DIAGNOSIS — B084 Enteroviral vesicular stomatitis with exanthem: Secondary | ICD-10-CM | POA: Diagnosis not present

## 2023-11-20 LAB — GROUP A STREP BY PCR: Group A Strep by PCR: NOT DETECTED

## 2023-11-20 MED ORDER — NYSTATIN 100000 UNIT/ML MT SUSP: OROMUCOSAL | 0 refills | Status: AC

## 2023-11-20 NOTE — ED Triage Notes (Signed)
 Tested negative for flus and COVID yesterday. I've been sick since Saturday. Woke up with a fever and splotches in back of throat. - Entered by patient

## 2023-11-20 NOTE — ED Provider Notes (Signed)
 [facility]    CSN: [id number] Arrival date & time: 11/20/23  1457      History   Chief Complaint Chief Complaint  Patient presents with   Fever    Tested negative for flus and COVID yesterday. I've been sick since Saturday. Woke up with a fever and splotches in back of throat. - Entered by patient   Sore Throat    HPI Kim Thompson is a 43 y.o. female presenting for 5 day history of fatigue, sore throat and congestion. Denies fever. Temps have been 98.5 degrees at the highest. Denies cough, chest pain, SOB, abdominal pain, vomiting or diarrhea. Negative home and COVID testing. Noticed "white spot" in throat recently. Has not had any meds today. History of asthma. Uses Advair  and makes sure to rinse mouth out after use.  HPI  History reviewed. No pertinent past medical history.  Patient Active Problem List   Diagnosis Date Noted   Seasonal allergies 05/16/2022   Elevated LDL cholesterol level 05/16/2022   Annual physical exam 12/14/2021   Screening for HIV (human immunodeficiency virus) 12/14/2021   Encounter for hepatitis C screening test for low risk patient 12/14/2021   Eczema 12/14/2021   Asthma 01/30/2017   Heartburn 01/30/2017   Migraine with aura 01/30/2017   Family history of colon cancer 01/30/2017    Past Surgical History:  Procedure Laterality Date   ADENOIDECTOMY  1994   CHOLECYSTECTOMY  2003   MYRINGOTOMY WITH TUBE PLACEMENT Bilateral    X's three times.    OB History     Gravida  0   Para  0   Term  0   Preterm  0   AB  0   Living  0      SAB  0   IAB  0   Ectopic  0   Multiple  0   Live Births  0            Home Medications    Prior to Admission medications   Medication Sig Start Date End Date Taking? Authorizing Provider  ADVAIR  DISKUS 100-50 MCG/DOSE AEPB TAKE 1 PUFF BY MOUTH TWICE A DAY 09/08/20  Yes Pollak, Adriana M, PA-C  cetirizine  (ZYRTEC ) 10 MG tablet Take 1 tablet (10 mg total) by mouth daily.  05/15/22  Yes Ostwalt, Janna, PA-C  montelukast  (SINGULAIR ) 10 MG tablet TAKE 1 TABLET BY MOUTH EVERYDAY AT BEDTIME 01/02/23  Yes Ostwalt, Janna, PA-C  nystatin (MYCOSTATIN) 100000 UNIT/ML suspension Swish and retain 5 ml in mouth for several seconds QID for 48 h after symptoms resolve 11/20/23  Yes Nancy Axon B, PA-C  omeprazole (PRILOSEC) 20 MG capsule Take 20 mg by mouth daily.   Yes [provider]  albuterol  (VENTOLIN  HFA) 108 (90 Base) MCG/ACT inhaler INHALE 1-2 PUFFS INTO THE LUNGS EVERY 4 HOURS AS NEEDED FOR WHEEZING OR SHORTNESS OF BREATH. 01/03/23   Ostwalt, Janna, PA-C  fluticasone  (FLONASE ) 50 MCG/ACT nasal spray Place 2 sprays into both nostrils daily. 05/15/22   Ostwalt, Janna, PA-C  fluticasone -salmeterol (ADVAIR ) 100-50 MCG/ACT AEPB Inhale 1 puff into the lungs 2 (two) times daily. 09/05/21   Ostwalt, Janna, PA-C  Spacer/Aero-Holding Chambers (AEROCHAMBER PLUS) inhaler Use with inhaler Patient not taking: Reported on 05/15/2022 05/25/20   Ethlyn Herd, MD  SUMAtriptan  (IMITREX ) 100 MG tablet TAKE 1 TABLET BY MOUTH (MAY REPEAT IN 2 HOURS IF HEADACHE PERSISTS OR RECURS) 10/11/20   Gordon Latus, PA-C  fexofenadine  (ALLEGRA ) 180 MG tablet TAKE 1  TABLET BY MOUTH EVERY DAY 05/03/17 05/25/20  Gordon Latus, PA-C    Family History Family History  Problem Relation Age of Onset   Irregular heart beat Mother    Asthma Mother    Asthma Father    Asthma Brother    Colon cancer Maternal Uncle    Diabetes Maternal Uncle    Thyroid  disease Maternal Grandmother    Healthy Maternal Grandmother    Osteoporosis Maternal Grandmother    Aortic aneurysm Maternal Grandfather    Diabetes Paternal Grandmother    Breast cancer Paternal Grandmother    Heart attack Paternal Grandmother    Stroke Paternal Grandmother    Thyroid  disease Paternal Grandmother    Hypertension Paternal Grandfather    Stroke Paternal Grandfather     Social History Social History   Tobacco Use    Smoking status: Never   Smokeless tobacco: Never  Vaping Use   Vaping status: Never Used  Substance Use Topics   Alcohol use: Yes    Comment: Very Rarely   Drug use: No     Allergies   Augmentin [amoxicillin-pot clavulanate] and Sulfa antibiotics   Review of Systems Review of Systems  Constitutional:  Positive for fatigue and fever. Negative for chills and diaphoresis.  HENT:  Positive for congestion, rhinorrhea and sore throat. Negative for ear pain and sinus pain.   Respiratory:  Negative for cough and shortness of breath.   Cardiovascular:  Negative for chest pain.  Gastrointestinal:  Negative for abdominal pain, nausea and vomiting.  Musculoskeletal:  Negative for arthralgias and myalgias.  Skin:  Negative for rash.  Neurological:  Negative for weakness and headaches.  Hematological:  Negative for adenopathy.     Physical Exam Triage Vital Signs ED Triage Vitals [11/20/23 1508]  Encounter Vitals Group     BP 129/87     Systolic BP Percentile      Diastolic BP Percentile      Pulse Rate 88     Resp 16     Temp 98.7 F (37.1 C)     Temp Source Oral     SpO2 97 %     Weight      Height      Head Circumference      Peak Flow      Pain Score 5     Pain Loc      Pain Education      Exclude from Growth Chart    No data found.  Updated Vital Signs BP 129/87 (BP Location: Right Arm)   Pulse 88   Temp 98.7 F (37.1 C) (Oral)   Resp 16   SpO2 97%    Physical Exam Vitals and nursing note reviewed.  Constitutional:      General: She is not in acute distress.    Appearance: Normal appearance. She is not ill-appearing or toxic-appearing.  HENT:     Head: Normocephalic and atraumatic.     Nose: Congestion present.     Mouth/Throat:     Mouth: Mucous membranes are moist.     Pharynx: Oropharyngeal exudate (small patch of whitish exudates of palate) and posterior oropharyngeal erythema present.  Eyes:     General: No scleral icterus.       Right eye: No  discharge.        Left eye: No discharge.     Conjunctiva/sclera: Conjunctivae normal.  Cardiovascular:     Rate and Rhythm: Normal rate and regular rhythm.  Heart sounds: Normal heart sounds.  Pulmonary:     Effort: Pulmonary effort is normal. No respiratory distress.     Breath sounds: Normal breath sounds.  Musculoskeletal:     Cervical back: Neck supple.  Skin:    General: Skin is dry.  Neurological:     General: No focal deficit present.     Mental Status: She is alert. Mental status is at baseline.     Motor: No weakness.     Gait: Gait normal.  Psychiatric:        Mood and Affect: Mood normal.        Behavior: Behavior normal.        Thought Content: Thought content normal.      UC Treatments / Results  Labs (all labs ordered are listed, but only abnormal results are displayed) Labs Reviewed  GROUP A STREP BY PCR    EKG   Radiology No results found.  Procedures Procedures (including critical care time)  Medications Ordered in UC Medications - No data to display  Initial Impression / Assessment and Plan / UC Course  I have reviewed the triage vital signs and the nursing notes.  Pertinent labs & imaging results that were available during my care of the patient were reviewed by me and considered in my medical decision making (see chart for details).   43 year old female presents for sore throat, congestion and fatigue for the past couple of days.  No elevated temperatures, but she has felt feverish at times. Denies cough, or weakness.  Negative COVID and flu testing at home.  Vitals are stable and normal. She is overall well-appearing.  No acute distress.  On exam slight nasal congestion, mild erythema posterior pharynx with a small patch of whitish exudate of the palate.  PCR strep test obtained.  Negative.  I reviewed the results with patient.  Examine her throat again.  Use a tongue depressor and was able to scrape off the exudates on the roof of her  mouth.  Explained this is consistent with thrush.  Likely related to steroid inhaler use.  Advised her to make sure she thoroughly rinse her mouth out after use.  Sent nystatin mouthwash and discussed supportive care.  Return precautions.   Final Clinical Impressions(s) / UC Diagnoses   Final diagnoses:  Thrush, oral  Sore throat     Discharge Instructions      -Strep negative -Presentation consistent with thrush. May also have a virus -Start using the mouthwash. Can also take tylenol, chloraseptic spray and increase fluids -If temps >100.4, worsening sore throat, chest pain, breathing issues not relieved by inhaler please return   ED Prescriptions     Medication Sig Dispense Auth. Provider   nystatin (MYCOSTATIN) 100000 UNIT/ML suspension Swish and retain 5 ml in mouth for several seconds QID for 48 h after symptoms resolve 473 mL Floydene Hy, PA-C      PDMP not reviewed this encounter.   Floydene Hy, PA-C 11/20/23 1656

## 2023-11-20 NOTE — Discharge Instructions (Addendum)
-  Strep negative -Presentation consistent with thrush. May also have a virus -Start using the mouthwash. Can also take tylenol, chloraseptic spray and increase fluids -If temps >100.4, worsening sore throat, chest pain, breathing issues not relieved by inhaler please return

## 2023-11-25 DIAGNOSIS — J309 Allergic rhinitis, unspecified: Secondary | ICD-10-CM | POA: Diagnosis not present

## 2023-11-26 DIAGNOSIS — Z9109 Other allergy status, other than to drugs and biological substances: Secondary | ICD-10-CM | POA: Diagnosis not present

## 2023-11-30 DIAGNOSIS — H9203 Otalgia, bilateral: Secondary | ICD-10-CM | POA: Diagnosis not present

## 2023-12-07 DIAGNOSIS — H6502 Acute serous otitis media, left ear: Secondary | ICD-10-CM | POA: Diagnosis not present

## 2023-12-07 DIAGNOSIS — H9202 Otalgia, left ear: Secondary | ICD-10-CM | POA: Diagnosis not present

## 2023-12-07 DIAGNOSIS — H66002 Acute suppurative otitis media without spontaneous rupture of ear drum, left ear: Secondary | ICD-10-CM | POA: Diagnosis not present

## 2023-12-09 DIAGNOSIS — S63631D Sprain of interphalangeal joint of left index finger, subsequent encounter: Secondary | ICD-10-CM | POA: Diagnosis not present

## 2023-12-10 DIAGNOSIS — Z9109 Other allergy status, other than to drugs and biological substances: Secondary | ICD-10-CM | POA: Diagnosis not present

## 2023-12-17 DIAGNOSIS — F331 Major depressive disorder, recurrent, moderate: Secondary | ICD-10-CM | POA: Diagnosis not present

## 2023-12-23 DIAGNOSIS — Z9109 Other allergy status, other than to drugs and biological substances: Secondary | ICD-10-CM | POA: Diagnosis not present

## 2023-12-24 DIAGNOSIS — F331 Major depressive disorder, recurrent, moderate: Secondary | ICD-10-CM | POA: Diagnosis not present

## 2024-01-03 DIAGNOSIS — F331 Major depressive disorder, recurrent, moderate: Secondary | ICD-10-CM | POA: Diagnosis not present

## 2024-01-07 DIAGNOSIS — F331 Major depressive disorder, recurrent, moderate: Secondary | ICD-10-CM | POA: Diagnosis not present

## 2024-01-13 DIAGNOSIS — F331 Major depressive disorder, recurrent, moderate: Secondary | ICD-10-CM | POA: Diagnosis not present

## 2024-01-21 DIAGNOSIS — Z9109 Other allergy status, other than to drugs and biological substances: Secondary | ICD-10-CM | POA: Diagnosis not present

## 2024-01-23 DIAGNOSIS — F331 Major depressive disorder, recurrent, moderate: Secondary | ICD-10-CM | POA: Diagnosis not present

## 2024-01-28 DIAGNOSIS — F331 Major depressive disorder, recurrent, moderate: Secondary | ICD-10-CM | POA: Diagnosis not present

## 2024-02-06 DIAGNOSIS — F331 Major depressive disorder, recurrent, moderate: Secondary | ICD-10-CM | POA: Diagnosis not present

## 2024-02-11 DIAGNOSIS — Z9109 Other allergy status, other than to drugs and biological substances: Secondary | ICD-10-CM | POA: Diagnosis not present

## 2024-02-12 DIAGNOSIS — F331 Major depressive disorder, recurrent, moderate: Secondary | ICD-10-CM | POA: Diagnosis not present

## 2024-02-18 DIAGNOSIS — F331 Major depressive disorder, recurrent, moderate: Secondary | ICD-10-CM | POA: Diagnosis not present

## 2024-02-28 DIAGNOSIS — F331 Major depressive disorder, recurrent, moderate: Secondary | ICD-10-CM | POA: Diagnosis not present

## 2024-03-02 DIAGNOSIS — J45901 Unspecified asthma with (acute) exacerbation: Secondary | ICD-10-CM | POA: Diagnosis not present

## 2024-03-05 DIAGNOSIS — F331 Major depressive disorder, recurrent, moderate: Secondary | ICD-10-CM | POA: Diagnosis not present

## 2024-03-06 DIAGNOSIS — J3089 Other allergic rhinitis: Secondary | ICD-10-CM | POA: Diagnosis not present

## 2024-03-08 DIAGNOSIS — J189 Pneumonia, unspecified organism: Secondary | ICD-10-CM | POA: Diagnosis not present

## 2024-03-15 DIAGNOSIS — R0789 Other chest pain: Secondary | ICD-10-CM | POA: Diagnosis not present

## 2024-03-15 DIAGNOSIS — R0602 Shortness of breath: Secondary | ICD-10-CM | POA: Diagnosis not present

## 2024-03-15 DIAGNOSIS — J4531 Mild persistent asthma with (acute) exacerbation: Secondary | ICD-10-CM | POA: Diagnosis not present

## 2024-03-19 DIAGNOSIS — F331 Major depressive disorder, recurrent, moderate: Secondary | ICD-10-CM | POA: Diagnosis not present

## 2024-03-23 DIAGNOSIS — F331 Major depressive disorder, recurrent, moderate: Secondary | ICD-10-CM | POA: Diagnosis not present

## 2024-03-27 DIAGNOSIS — Z9109 Other allergy status, other than to drugs and biological substances: Secondary | ICD-10-CM | POA: Diagnosis not present

## 2024-04-02 DIAGNOSIS — F331 Major depressive disorder, recurrent, moderate: Secondary | ICD-10-CM | POA: Diagnosis not present

## 2024-04-08 DIAGNOSIS — F331 Major depressive disorder, recurrent, moderate: Secondary | ICD-10-CM | POA: Diagnosis not present

## 2024-04-29 DIAGNOSIS — Z9109 Other allergy status, other than to drugs and biological substances: Secondary | ICD-10-CM | POA: Diagnosis not present

## 2024-05-06 DIAGNOSIS — F331 Major depressive disorder, recurrent, moderate: Secondary | ICD-10-CM | POA: Diagnosis not present

## 2024-05-14 DIAGNOSIS — F331 Major depressive disorder, recurrent, moderate: Secondary | ICD-10-CM | POA: Diagnosis not present

## 2024-05-29 DIAGNOSIS — Z9109 Other allergy status, other than to drugs and biological substances: Secondary | ICD-10-CM | POA: Diagnosis not present

## 2024-06-08 DIAGNOSIS — F331 Major depressive disorder, recurrent, moderate: Secondary | ICD-10-CM | POA: Diagnosis not present

## 2024-06-17 DIAGNOSIS — F331 Major depressive disorder, recurrent, moderate: Secondary | ICD-10-CM | POA: Diagnosis not present

## 2024-06-22 DIAGNOSIS — M25562 Pain in left knee: Secondary | ICD-10-CM | POA: Diagnosis not present

## 2024-06-22 DIAGNOSIS — S8392XA Sprain of unspecified site of left knee, initial encounter: Secondary | ICD-10-CM | POA: Diagnosis not present

## 2024-06-22 DIAGNOSIS — M7989 Other specified soft tissue disorders: Secondary | ICD-10-CM | POA: Diagnosis not present

## 2024-06-22 DIAGNOSIS — S9032XA Contusion of left foot, initial encounter: Secondary | ICD-10-CM | POA: Diagnosis not present

## 2024-06-25 DIAGNOSIS — F331 Major depressive disorder, recurrent, moderate: Secondary | ICD-10-CM | POA: Diagnosis not present

## 2024-06-26 DIAGNOSIS — Z9109 Other allergy status, other than to drugs and biological substances: Secondary | ICD-10-CM | POA: Diagnosis not present
